# Patient Record
Sex: Female | Born: 1995
Health system: Southern US, Community
[De-identification: ages and names within clinical notes are randomized; demographics above are authoritative.]

---

## 2018-03-19 LAB — HIV ANTIBODY (ROUTINE TESTING W REFLEX): HIV 1&2 Ab, 4th Generation: NONREACTIVE

## 2018-03-30 ENCOUNTER — Ambulatory Visit: Payer: BLUE CROSS/BLUE SHIELD | Admitting: Nurse Practitioner

## 2018-03-30 ENCOUNTER — Encounter: Payer: Self-pay | Admitting: Nurse Practitioner

## 2018-03-30 ENCOUNTER — Other Ambulatory Visit: Payer: Self-pay

## 2018-03-30 VITALS — BP 114/78 | HR 82 | Temp 99.0°F | Ht 63.0 in | Wt 129.8 lb

## 2018-03-30 DIAGNOSIS — R590 Localized enlarged lymph nodes: Secondary | ICD-10-CM

## 2018-03-30 LAB — CBC WITH DIFFERENTIAL/PLATELET
Basophils Absolute: 0.1 10*3/uL (ref 0.0–0.1)
Basophils Relative: 2.2 % (ref 0.0–3.0)
Eosinophils Absolute: 0.1 10*3/uL (ref 0.0–0.7)
Eosinophils Relative: 3.3 % (ref 0.0–5.0)
HCT: 38.6 % (ref 36.0–46.0)
Hemoglobin: 12.9 g/dL (ref 12.0–15.0)
Lymphocytes Relative: 43.6 % (ref 12.0–46.0)
Lymphs Abs: 1.8 10*3/uL (ref 0.7–4.0)
MCHC: 33.3 g/dL (ref 30.0–36.0)
MCV: 103 fl — ABNORMAL HIGH (ref 78.0–100.0)
MONO ABS: 0.3 10*3/uL (ref 0.1–1.0)
Monocytes Relative: 7.2 % (ref 3.0–12.0)
Neutro Abs: 1.8 10*3/uL (ref 1.4–7.7)
Neutrophils Relative %: 43.7 % (ref 43.0–77.0)
Platelets: 333 10*3/uL (ref 150.0–400.0)
RBC: 3.75 Mil/uL — ABNORMAL LOW (ref 3.87–5.11)
RDW: 11.7 % (ref 11.5–15.5)
WBC: 4.2 10*3/uL (ref 4.0–10.5)

## 2018-03-30 NOTE — Progress Notes (Signed)
Subjective:  Patient ID: Lindsay Atkins, female    DOB: [birth year]  Age: 23 y.o. MRN: [id number]  CC: neck mass (R side of neck/jaw line, onset months ago. )  HPI Lindsay Atkins is here to establish care and to discuss the presence of right side neck mass, onset 69months ago, unchanged. No Hx of malignancy, denies any recent URI No tobacco use, no illicit drug use, social use of ETOH.  She had STD screen completed 02/2018: HIV, RPR, GC/chlamydia and Trich (negative) Reviewed lab results via patient's mobile device  [facility] with [facility], use of OCP: Triprevifem Sexually active, no condom use. Last PAP 03/25/18: normal per patient, no Hx of abnormal PAP.  Reviewed past Medical, Social and Family history today.  Outpatient Medications Prior to Visit  Medication Sig Dispense Refill  . TRI-PREVIFEM 0.18/0.215/0.25 MG-35 MCG tablet      No facility-administered medications prior to visit.     ROS Review of Systems  Constitutional: Negative.   HENT: Negative.   Respiratory: Negative.   Cardiovascular: Negative.   Musculoskeletal: Negative.   Skin: Negative.   Neurological: Negative.   Endo/Heme/Allergies: Negative.   Psychiatric/Behavioral: Negative.      Objective:  BP 114/78   Pulse 82   Temp 99 F (37.2 C) (Oral)   Ht 5\' 3"  (1.6 m)   Wt 129 lb 12.8 oz (58.9 kg)   SpO2 97%   BMI 22.99 kg/m   BP Readings from Last 3 Encounters:  03/30/18 114/78    Wt Readings from Last 3 Encounters:  03/30/18 129 lb 12.8 oz (58.9 kg)    Physical Exam HENT:     Head:     Salivary Glands: Right salivary gland is not diffusely enlarged or tender. Left salivary gland is not diffusely enlarged or tender.     Right Ear: Tympanic membrane, ear canal and external ear normal.     Left Ear: Tympanic membrane, ear canal and external ear normal.     Nose: Nose normal.     Mouth/Throat:     Mouth: Mucous membranes are moist.     Tongue: No lesions.     Pharynx: Uvula midline. No  posterior oropharyngeal erythema.     Tonsils: No tonsillar exudate. Swelling: 0 on the right. 0 on the left.  Neck:     Musculoskeletal: Full passive range of motion without pain, normal range of motion and neck supple.     Thyroid: No thyroid mass, thyromegaly or thyroid tenderness.   Cardiovascular:     Rate and Rhythm: Normal rate.     Pulses: Normal pulses.  Pulmonary:     Effort: Pulmonary effort is normal.  Psychiatric:        Mood and Affect: Mood normal.        Behavior: Behavior normal.    Lab Results  Component Value Date   WBC 4.2 03/30/2018   HGB 12.9 03/30/2018   HCT 38.6 03/30/2018   PLT 333.0 03/30/2018     Assessment & Plan:   Lindsay Atkins was seen today for neck mass.  Diagnoses and all orders for this visit:  Lymphadenopathy, periauricular -     CBC w/Diff -     US SOFT TISSUE HEAD & NECK (NON-THYROID); Future   I am having Lindsay Atkins maintain her Tri-Previfem.  No orders of the defined types were placed in this encounter.   Problem List Items Addressed This Visit    None    Visit Diagnoses  Lymphadenopathy, periauricular    -  Primary   Relevant Orders   CBC w/Diff (Completed)   US SOFT TISSUE HEAD & NECK (NON-THYROID)       Follow-up: Return if symptoms worsen or fail to improve.  Alysia Penna, NP

## 2018-03-30 NOTE — Patient Instructions (Addendum)
You will be contacted to schedule neck US.  Normal CBC  Please send copy of recent STD screen via mychart.  Lymphadenopathy  Lymphadenopathy means that your lymph glands are swollen or larger than normal (enlarged). Lymph glands, also called lymph nodes, are collections of tissue that filter bacteria, viruses, and waste from your bloodstream. They are part of your body's disease-fighting system (immune system), which protects your body from germs. There may be different causes of lymphadenopathy, depending on where it is in your body. Some types go away on their own. Lymphadenopathy can occur anywhere that you have lymph glands, including these areas:  Neck (cervical lymphadenopathy).  Chest (mediastinal lymphadenopathy).  Lungs (hilar lymphadenopathy).  Underarms (axillary lymphadenopathy).  Groin (inguinal lymphadenopathy). When your immune system responds to germs, infection-fighting cells and fluid build up in your lymph glands. This causes some swelling and enlargement. If the lymph glands do not go back to normal after you have an infection or disease, your health care provider may do tests. These tests help to monitor your condition and find the reason why the glands are still swollen and enlarged. Follow these instructions at home:  Get plenty of rest.  Take over-the-counter and prescription medicines only as told by your health care provider. Your health care provider may recommend over-the-counter medicines for pain.  If directed, apply heat to swollen lymph glands as often as told by your health care provider. Use the heat source that your health care provider recommends, such as a moist heat pack or a heating pad. ? Place a towel between your skin and the heat source. ? Leave the heat on for 20-30 minutes. ? Remove the heat if your skin turns bright red. This is especially important if you are unable to feel pain, heat, or cold. You may have a greater risk of getting  burned.  Check your affected lymph glands every day for changes. Check other lymph gland areas as told by your health care provider. Check for changes such as: ? More swelling. ? Sudden increase in size. ? Redness or pain. ? Hardness.  Keep all follow-up visits as told by your health care provider. This is important. Contact a health care provider if you have:  Swelling that gets worse or spreads to other areas.  Problems with breathing.  Lymph glands that: ? Are still swollen after 2 weeks. ? Have suddenly gotten bigger. ? Are red, painful, or hard.  A fever or chills.  Fatigue.  A sore throat.  Pain in your abdomen.  Weight loss.  Night sweats. Get help right away if you have:  Fluid leaking from an enlarged lymph gland.  Severe pain.  Chest pain.  Shortness of breath. Summary  Lymphadenopathy means that your lymph glands are swollen or larger than normal (enlarged).  Lymph glands (also called lymph nodes) are collections of tissue that filter bacteria, viruses, and waste from the bloodstream. They are part of your body's disease-fighting system (immune system).  Lymphadenopathy can occur anywhere that you have lymph glands.  If your enlarged and swollen lymph glands do not go back to normal after you have an infection or disease, your health care provider may do tests to monitor your condition and find the reason why the glands are still swollen and enlarged.  Check your affected lymph glands every day for changes. Check other lymph gland areas as told by your health care provider. This information is not intended to replace advice given to you by your health care  provider. Make sure you discuss any questions you have with your health care provider. Document Released: 10/15/2007 Document Revised: 11/20/2016 Document Reviewed: 11/20/2016 Elsevier Interactive Patient Education  2019 [employer].

## 2018-04-06 ENCOUNTER — Other Ambulatory Visit: Payer: BLUE CROSS/BLUE SHIELD

## 2018-04-15 ENCOUNTER — Encounter: Payer: Self-pay | Admitting: Nurse Practitioner

## 2018-04-15 LAB — GC, CHLAMYDIA, RPR PANEL
Chlamydia trachomatis, NAA: NEGATIVE
Neisseria gonorrhoeae, NAA: NEGATIVE

## 2018-04-15 LAB — TRICH VAG BY NAA: Trich vag by NAA: NEGATIVE

## 2018-04-15 LAB — RPR: RPR: NONREACTIVE

## 2018-04-15 NOTE — Progress Notes (Signed)
Abstracted result and sent to scan  

## 2018-05-18 ENCOUNTER — Telehealth: Payer: Self-pay | Admitting: Nurse Practitioner

## 2018-05-18 NOTE — Telephone Encounter (Signed)
I called and left message on patient voicemail per Alysia Penna to call office and schedule virtual visit follow up.

## 2019-01-02 ENCOUNTER — Telehealth: Payer: BLUE CROSS/BLUE SHIELD | Admitting: Family

## 2019-01-02 DIAGNOSIS — J209 Acute bronchitis, unspecified: Secondary | ICD-10-CM | POA: Diagnosis not present

## 2019-01-02 MED ORDER — BENZONATATE 100 MG PO CAPS: 100.0000 mg | ORAL_CAPSULE | Freq: Three times a day (TID) | ORAL | 0 refills | Status: DC | PRN

## 2019-01-02 NOTE — Progress Notes (Signed)
We are sorry that you are not feeling well.  Here is how we plan to help!  Based on your presentation I believe you most likely have A cough due to a virus.  This is called viral bronchitis and is best treated by rest, plenty of fluids and control of the cough.  You may use Ibuprofen or Tylenol as directed to help your symptoms.     In addition you may use A non-prescription cough medication called Robitussin DAC. Take 2 teaspoons every 8 hours or Delsym: take 2 teaspoons every 12 hours. and A prescription cough medication called Tessalon Perles 100mg. You may take 1-2 capsules every 8 hours as needed for your cough.    From your responses in the eVisit questionnaire you describe inflammation in the upper respiratory tract which is causing a significant cough.  This is commonly called Bronchitis and has four common causes:    Allergies  Viral Infections  Acid Reflux  Bacterial Infection Allergies, viruses and acid reflux are treated by controlling symptoms or eliminating the cause. An example might be a cough caused by taking certain blood pressure medications. You stop the cough by changing the medication. Another example might be a cough caused by acid reflux. Controlling the reflux helps control the cough.  USE OF BRONCHODILATOR ("RESCUE") INHALERS: There is a risk from using your bronchodilator too frequently.  The risk is that over-reliance on a medication which only relaxes the muscles surrounding the breathing tubes can reduce the effectiveness of medications prescribed to reduce swelling and congestion of the tubes themselves.  Although you feel brief relief from the bronchodilator inhaler, your asthma may actually be worsening with the tubes becoming more swollen and filled with mucus.  This can delay other crucial treatments, such as oral steroid medications. If you need to use a bronchodilator inhaler daily, several times per day, you should discuss this with your provider.  There are  probably better treatments that could be used to keep your asthma under control.     HOME CARE . Only take medications as instructed by your medical team. . Complete the entire course of an antibiotic. . Drink plenty of fluids and get plenty of rest. . Avoid close contacts especially the very young and the elderly . Cover your mouth if you cough or cough into your sleeve. . Always remember to wash your hands . A steam or ultrasonic humidifier can help congestion.   GET HELP RIGHT AWAY IF: . You develop worsening fever. . You become short of breath . You cough up blood. . Your symptoms persist after you have completed your treatment plan MAKE SURE YOU   Understand these instructions.  Will watch your condition.  Will get help right away if you are not doing well or get worse.  Your e-visit answers were reviewed by a board certified advanced clinical practitioner to complete your personal care plan.  Depending on the condition, your plan could have included both over the counter or prescription medications. If there is a problem please reply  once you have received a response from your provider. Your safety is important to us.  If you have drug allergies check your prescription carefully.    You can use MyChart to ask questions about today's visit, request a non-urgent call back, or ask for a work or school excuse for 24 hours related to this e-Visit. If it has been greater than 24 hours you will need to follow up with your provider, or enter   a new e-Visit to address those concerns. You will get an e-mail in the next two days asking about your experience.  I hope that your e-visit has been valuable and will speed your recovery. Thank you for using e-visits.  Approximately 5 minutes was spent documenting and reviewing patient's chart.    

## 2019-10-05 LAB — CBC AND DIFFERENTIAL
HCT: 37 (ref 36–46)
Hemoglobin: 12.6 (ref 12.0–16.0)
Platelets: 312 (ref 150–399)
WBC: 10

## 2019-10-05 LAB — VITAMIN B12: Vitamin B-12: 527

## 2019-10-05 LAB — IRON,TIBC AND FERRITIN PANEL
Ferritin: 73
Iron: 118
UIBC: 197

## 2019-10-05 LAB — CBC: RBC: 3.74 — AB (ref 3.87–5.11)

## 2019-10-05 LAB — TSH: TSH: 0.45 (ref <=5.90)

## 2019-10-05 LAB — VITAMIN D 25 HYDROXY (VIT D DEFICIENCY, FRACTURES): Vit D, 25-Hydroxy: 16.5

## 2019-11-16 ENCOUNTER — Encounter: Payer: Self-pay | Admitting: Nurse Practitioner

## 2019-11-21 ENCOUNTER — Ambulatory Visit: Payer: BC Managed Care – PPO | Admitting: Nurse Practitioner

## 2019-12-05 ENCOUNTER — Other Ambulatory Visit: Payer: Self-pay

## 2019-12-05 ENCOUNTER — Ambulatory Visit: Payer: BC Managed Care – PPO | Admitting: Nurse Practitioner

## 2019-12-05 ENCOUNTER — Encounter: Payer: Self-pay | Admitting: Nurse Practitioner

## 2019-12-05 VITALS — BP 100/70 | HR 92 | Temp 97.3°F | Ht 63.0 in | Wt 125.6 lb

## 2019-12-05 DIAGNOSIS — R590 Localized enlarged lymph nodes: Secondary | ICD-10-CM | POA: Diagnosis not present

## 2019-12-05 DIAGNOSIS — R7989 Other specified abnormal findings of blood chemistry: Secondary | ICD-10-CM | POA: Diagnosis not present

## 2019-12-05 DIAGNOSIS — R300 Dysuria: Secondary | ICD-10-CM

## 2019-12-05 DIAGNOSIS — L659 Nonscarring hair loss, unspecified: Secondary | ICD-10-CM

## 2019-12-05 NOTE — Patient Instructions (Signed)
Sign medical release to get records from GYN and dermatology.  Go to lab for blood draw.

## 2019-12-05 NOTE — Progress Notes (Signed)
Subjective:  Patient ID: Lindsay Atkins, female    DOB: [birth year]  Age: 24 y.o. MRN: [id number]  CC: Follow-up (Pt seen dermatologist last month and was informed her blood work came back indicating low thyroid and is following up regarding that. )  HPI She was informed her TSh was low 84month ago by dermatology. She was seen due to hair loss and low vit.D. no weight loss, no cold/heat intolerance, no irregular cycle. No FHx of thyroid disease. No OTC supplements She was treated for UTI 7month ago,will like repeat UA.  Reviewed past Medical, Social and Family history today.  Outpatient Medications Prior to Visit  Medication Sig Dispense Refill  . Vitamin D, Ergocalciferol, (DRISDOL) 1.25 MG (50000 UNIT) CAPS capsule Take 50,000 Units by mouth once a week.    . benzonatate (TESSALON PERLES) 100 MG capsule Take 1 capsule (100 mg total) by mouth 3 (three) times daily as needed. (Patient not taking: Reported on 12/05/2019) 20 capsule 0  . TRI-PREVIFEM 0.18/0.215/0.25 MG-35 MCG tablet      No facility-administered medications prior to visit.    ROS See HPI  Objective:  BP 100/70 (BP Location: Left Arm, Patient Position: Sitting, Cuff Size: Normal)   Pulse 92   Temp (!) 97.3 F (36.3 C) (Temporal)   Ht 5\' 3"  (1.6 m)   Wt 125 lb 9.6 oz (57 kg)   SpO2 98%   BMI 22.25 kg/m   Physical Exam Neck:     Thyroid: No thyroid mass, thyromegaly or thyroid tenderness.  Cardiovascular:     Rate and Rhythm: Normal rate.     Pulses: Normal pulses.  Pulmonary:     Effort: Pulmonary effort is normal.  Musculoskeletal:     Cervical back: Normal range of motion and neck supple.  Lymphadenopathy:     Cervical: Cervical adenopathy present.  Neurological:     Mental Status: She is alert and oriented to person, place, and time.     Assessment & Plan:  This visit occurred during the SARS-CoV-2 public health emergency.  Safety protocols were in place, including screening questions prior to the  visit, additional usage of staff PPE, and extensive cleaning of exam room while observing appropriate contact time as indicated for disinfecting solutions.   Cherysh was seen today for follow-up.  Diagnoses and all orders for this visit:  Abnormal TSH -     Thyroid Panel With TSH -     Thyroid peroxidase antibody -     Thyroid stimulating immunoglobulin  Lymphadenopathy, periauricular -     Cancel: Wilnette Kales Soft Tissue Head/Neck (NON-THYROID); Future -     US Soft Tissue Head/Neck (NON-THYROID); Future  Hair loss -     Thyroid Panel With TSH -     Thyroid peroxidase antibody -     Thyroid stimulating immunoglobulin  Dysuria -     Urinalysis w microscopic + reflex cultur -     REFLEXIVE URINE CULTURE  normal thyroid panel, thyroid US not needed at this time  Problem List Items Addressed This Visit    None    Visit Diagnoses    Abnormal TSH    -  Primary   Relevant Orders   Thyroid Panel With TSH (Completed)   Thyroid peroxidase antibody (Completed)   Thyroid stimulating immunoglobulin   Lymphadenopathy, periauricular       Relevant Orders   US Soft Tissue Head/Neck (NON-THYROID)   Hair loss       Relevant Orders   Thyroid Panel  With TSH (Completed)   Thyroid peroxidase antibody (Completed)   Thyroid stimulating immunoglobulin   Dysuria       Relevant Orders   Urinalysis w microscopic + reflex cultur (Completed)   REFLEXIVE URINE CULTURE (Completed)      Follow-up: Return if symptoms worsen or fail to improve.  Alysia Penna, NP

## 2019-12-06 ENCOUNTER — Encounter: Payer: Self-pay | Admitting: Nurse Practitioner

## 2019-12-11 LAB — THYROID PANEL WITH TSH
Free Thyroxine Index: 2.8 (ref 1.4–3.8)
T3 Uptake: 32 % (ref 22–35)
T4, Total: 8.9 ug/dL (ref 5.1–11.9)
TSH: 1.28 mIU/L

## 2019-12-11 LAB — URINALYSIS W MICROSCOPIC + REFLEX CULTURE
Bacteria, UA: NONE SEEN /HPF
Bilirubin Urine: NEGATIVE
Glucose, UA: NEGATIVE
Hgb urine dipstick: NEGATIVE
Hyaline Cast: NONE SEEN /LPF
Ketones, ur: NEGATIVE
Leukocyte Esterase: NEGATIVE
Nitrites, Initial: NEGATIVE
Protein, ur: NEGATIVE
RBC / HPF: NONE SEEN /HPF (ref 0–2)
Specific Gravity, Urine: 1.017 (ref 1.001–1.03)
Squamous Epithelial / HPF: NONE SEEN /HPF (ref <=5)
WBC, UA: NONE SEEN /HPF (ref 0–5)
pH: <=5 (ref 5.0–8.0)

## 2019-12-11 LAB — THYROID STIMULATING IMMUNOGLOBULIN: TSI: <89 % baseline (ref <140)

## 2019-12-11 LAB — NO CULTURE INDICATED

## 2019-12-11 LAB — THYROID PEROXIDASE ANTIBODY: Thyroperoxidase Ab SerPl-aCnc: 2 IU/mL (ref <9)

## 2019-12-29 ENCOUNTER — Ambulatory Visit
Admission: RE | Admit: 2019-12-29 | Discharge: 2019-12-29 | Disposition: A | Discharge status: Home / Self Care | Payer: BC Managed Care – PPO | Source: Ambulatory Visit | Attending: Nurse Practitioner | Admitting: Nurse Practitioner

## 2019-12-29 DIAGNOSIS — R590 Localized enlarged lymph nodes: Secondary | ICD-10-CM

## 2019-12-31 ENCOUNTER — Encounter: Payer: Self-pay | Admitting: Nurse Practitioner

## 2019-12-31 ENCOUNTER — Other Ambulatory Visit: Payer: Self-pay | Admitting: Nurse Practitioner

## 2019-12-31 DIAGNOSIS — K118 Other diseases of salivary glands: Secondary | ICD-10-CM

## 2019-12-31 DIAGNOSIS — R590 Localized enlarged lymph nodes: Secondary | ICD-10-CM

## 2020-01-22 DIAGNOSIS — F341 Dysthymic disorder: Secondary | ICD-10-CM | POA: Diagnosis not present

## 2020-01-28 ENCOUNTER — Other Ambulatory Visit: Payer: BC Managed Care – PPO

## 2020-03-31 DIAGNOSIS — T391X1A Poisoning by 4-Aminophenol derivatives, accidental (unintentional), initial encounter: Secondary | ICD-10-CM | POA: Diagnosis not present

## 2020-03-31 DIAGNOSIS — E876 Hypokalemia: Secondary | ICD-10-CM | POA: Diagnosis not present

## 2020-03-31 DIAGNOSIS — R000 Tachycardia, unspecified: Secondary | ICD-10-CM | POA: Diagnosis not present

## 2020-03-31 DIAGNOSIS — Z9114 Patient's other noncompliance with medication regimen: Secondary | ICD-10-CM | POA: Diagnosis not present

## 2020-03-31 DIAGNOSIS — T391X2A Poisoning by 4-Aminophenol derivatives, intentional self-harm, initial encounter: Secondary | ICD-10-CM | POA: Diagnosis not present

## 2020-03-31 DIAGNOSIS — F419 Anxiety disorder, unspecified: Secondary | ICD-10-CM | POA: Diagnosis not present

## 2020-03-31 DIAGNOSIS — F332 Major depressive disorder, recurrent severe without psychotic features: Secondary | ICD-10-CM | POA: Diagnosis not present

## 2020-03-31 DIAGNOSIS — T391X4A Poisoning by 4-Aminophenol derivatives, undetermined, initial encounter: Secondary | ICD-10-CM | POA: Diagnosis not present

## 2020-03-31 DIAGNOSIS — Z20822 Contact with and (suspected) exposure to covid-19: Secondary | ICD-10-CM | POA: Diagnosis not present

## 2020-03-31 DIAGNOSIS — X58XXXA Exposure to other specified factors, initial encounter: Secondary | ICD-10-CM | POA: Diagnosis not present

## 2020-03-31 DIAGNOSIS — Z3202 Encounter for pregnancy test, result negative: Secondary | ICD-10-CM | POA: Diagnosis not present

## 2020-03-31 DIAGNOSIS — I498 Other specified cardiac arrhythmias: Secondary | ICD-10-CM | POA: Diagnosis not present

## 2020-03-31 DIAGNOSIS — Y999 Unspecified external cause status: Secondary | ICD-10-CM | POA: Diagnosis not present

## 2020-04-01 DIAGNOSIS — I498 Other specified cardiac arrhythmias: Secondary | ICD-10-CM | POA: Diagnosis not present

## 2020-04-01 DIAGNOSIS — T391X1A Poisoning by 4-Aminophenol derivatives, accidental (unintentional), initial encounter: Secondary | ICD-10-CM | POA: Diagnosis not present

## 2020-04-01 DIAGNOSIS — R000 Tachycardia, unspecified: Secondary | ICD-10-CM | POA: Diagnosis not present

## 2020-04-02 DIAGNOSIS — T391X1A Poisoning by 4-Aminophenol derivatives, accidental (unintentional), initial encounter: Secondary | ICD-10-CM | POA: Diagnosis not present

## 2020-04-02 DIAGNOSIS — R000 Tachycardia, unspecified: Secondary | ICD-10-CM | POA: Diagnosis not present

## 2020-04-03 DIAGNOSIS — T391X1A Poisoning by 4-Aminophenol derivatives, accidental (unintentional), initial encounter: Secondary | ICD-10-CM | POA: Diagnosis not present

## 2020-04-03 DIAGNOSIS — T391X4A Poisoning by 4-Aminophenol derivatives, undetermined, initial encounter: Secondary | ICD-10-CM | POA: Diagnosis not present

## 2020-04-03 DIAGNOSIS — R000 Tachycardia, unspecified: Secondary | ICD-10-CM | POA: Diagnosis not present

## 2020-04-04 DIAGNOSIS — F332 Major depressive disorder, recurrent severe without psychotic features: Secondary | ICD-10-CM | POA: Diagnosis not present

## 2020-04-04 DIAGNOSIS — T391X1A Poisoning by 4-Aminophenol derivatives, accidental (unintentional), initial encounter: Secondary | ICD-10-CM | POA: Diagnosis not present

## 2020-04-05 DIAGNOSIS — T391X2A Poisoning by 4-Aminophenol derivatives, intentional self-harm, initial encounter: Secondary | ICD-10-CM | POA: Diagnosis not present

## 2020-04-05 DIAGNOSIS — F332 Major depressive disorder, recurrent severe without psychotic features: Secondary | ICD-10-CM | POA: Diagnosis not present

## 2020-04-10 DIAGNOSIS — Z01419 Encounter for gynecological examination (general) (routine) without abnormal findings: Secondary | ICD-10-CM | POA: Diagnosis not present

## 2020-04-10 DIAGNOSIS — Z124 Encounter for screening for malignant neoplasm of cervix: Secondary | ICD-10-CM | POA: Diagnosis not present

## 2020-04-10 DIAGNOSIS — Z3202 Encounter for pregnancy test, result negative: Secondary | ICD-10-CM | POA: Diagnosis not present

## 2020-04-10 DIAGNOSIS — Z113 Encounter for screening for infections with a predominantly sexual mode of transmission: Secondary | ICD-10-CM | POA: Diagnosis not present

## 2020-04-10 DIAGNOSIS — N925 Other specified irregular menstruation: Secondary | ICD-10-CM | POA: Diagnosis not present

## 2020-04-17 DIAGNOSIS — F341 Dysthymic disorder: Secondary | ICD-10-CM | POA: Diagnosis not present

## 2020-04-19 DIAGNOSIS — N3000 Acute cystitis without hematuria: Secondary | ICD-10-CM | POA: Diagnosis not present

## 2020-04-19 DIAGNOSIS — R350 Frequency of micturition: Secondary | ICD-10-CM | POA: Diagnosis not present

## 2020-05-01 DIAGNOSIS — F341 Dysthymic disorder: Secondary | ICD-10-CM | POA: Diagnosis not present

## 2020-05-08 DIAGNOSIS — F341 Dysthymic disorder: Secondary | ICD-10-CM | POA: Diagnosis not present

## 2020-05-08 DIAGNOSIS — F411 Generalized anxiety disorder: Secondary | ICD-10-CM | POA: Diagnosis not present

## 2020-05-15 DIAGNOSIS — F341 Dysthymic disorder: Secondary | ICD-10-CM | POA: Diagnosis not present

## 2020-05-15 DIAGNOSIS — F411 Generalized anxiety disorder: Secondary | ICD-10-CM | POA: Diagnosis not present

## 2020-05-23 DIAGNOSIS — F411 Generalized anxiety disorder: Secondary | ICD-10-CM | POA: Diagnosis not present

## 2020-05-23 DIAGNOSIS — F341 Dysthymic disorder: Secondary | ICD-10-CM | POA: Diagnosis not present

## 2020-05-29 DIAGNOSIS — F411 Generalized anxiety disorder: Secondary | ICD-10-CM | POA: Diagnosis not present

## 2020-06-05 DIAGNOSIS — F411 Generalized anxiety disorder: Secondary | ICD-10-CM | POA: Diagnosis not present

## 2020-06-12 DIAGNOSIS — F411 Generalized anxiety disorder: Secondary | ICD-10-CM | POA: Diagnosis not present

## 2020-06-25 DIAGNOSIS — F341 Dysthymic disorder: Secondary | ICD-10-CM | POA: Diagnosis not present

## 2020-07-02 DIAGNOSIS — F341 Dysthymic disorder: Secondary | ICD-10-CM | POA: Diagnosis not present

## 2020-07-09 DIAGNOSIS — F341 Dysthymic disorder: Secondary | ICD-10-CM | POA: Diagnosis not present

## 2020-07-16 DIAGNOSIS — F341 Dysthymic disorder: Secondary | ICD-10-CM | POA: Diagnosis not present

## 2020-07-23 DIAGNOSIS — F341 Dysthymic disorder: Secondary | ICD-10-CM | POA: Diagnosis not present

## 2020-07-30 DIAGNOSIS — F341 Dysthymic disorder: Secondary | ICD-10-CM | POA: Diagnosis not present

## 2020-08-06 DIAGNOSIS — F341 Dysthymic disorder: Secondary | ICD-10-CM | POA: Diagnosis not present

## 2020-08-13 DIAGNOSIS — F341 Dysthymic disorder: Secondary | ICD-10-CM | POA: Diagnosis not present

## 2020-08-20 DIAGNOSIS — F411 Generalized anxiety disorder: Secondary | ICD-10-CM | POA: Diagnosis not present

## 2020-08-27 DIAGNOSIS — F341 Dysthymic disorder: Secondary | ICD-10-CM | POA: Diagnosis not present

## 2020-09-03 DIAGNOSIS — F341 Dysthymic disorder: Secondary | ICD-10-CM | POA: Diagnosis not present

## 2020-09-05 DIAGNOSIS — Z124 Encounter for screening for malignant neoplasm of cervix: Secondary | ICD-10-CM | POA: Diagnosis not present

## 2020-09-05 DIAGNOSIS — N925 Other specified irregular menstruation: Secondary | ICD-10-CM | POA: Diagnosis not present

## 2020-09-05 DIAGNOSIS — Z3480 Encounter for supervision of other normal pregnancy, unspecified trimester: Secondary | ICD-10-CM | POA: Diagnosis not present

## 2020-09-05 DIAGNOSIS — Z113 Encounter for screening for infections with a predominantly sexual mode of transmission: Secondary | ICD-10-CM | POA: Diagnosis not present

## 2020-09-05 DIAGNOSIS — Z3201 Encounter for pregnancy test, result positive: Secondary | ICD-10-CM | POA: Diagnosis not present

## 2020-09-05 LAB — OB RESULTS CONSOLE GC/CHLAMYDIA
Chlamydia: NEGATIVE
Gonorrhea: NEGATIVE

## 2020-09-05 LAB — OB RESULTS CONSOLE HIV ANTIBODY (ROUTINE TESTING): HIV: NONREACTIVE

## 2020-09-05 LAB — OB RESULTS CONSOLE RPR: RPR: NONREACTIVE

## 2020-09-05 LAB — HEPATITIS C ANTIBODY: HCV Ab: NEGATIVE

## 2020-09-05 LAB — OB RESULTS CONSOLE RUBELLA ANTIBODY, IGM: Rubella: IMMUNE

## 2020-09-05 LAB — OB RESULTS CONSOLE HEPATITIS B SURFACE ANTIGEN: Hepatitis B Surface Ag: NEGATIVE

## 2020-09-10 DIAGNOSIS — F341 Dysthymic disorder: Secondary | ICD-10-CM | POA: Diagnosis not present

## 2020-09-17 DIAGNOSIS — F341 Dysthymic disorder: Secondary | ICD-10-CM | POA: Diagnosis not present

## 2020-09-24 DIAGNOSIS — F341 Dysthymic disorder: Secondary | ICD-10-CM | POA: Diagnosis not present

## 2020-10-01 DIAGNOSIS — F341 Dysthymic disorder: Secondary | ICD-10-CM | POA: Diagnosis not present

## 2020-10-03 DIAGNOSIS — Z23 Encounter for immunization: Secondary | ICD-10-CM | POA: Diagnosis not present

## 2020-10-08 DIAGNOSIS — F341 Dysthymic disorder: Secondary | ICD-10-CM | POA: Diagnosis not present

## 2020-10-15 DIAGNOSIS — F341 Dysthymic disorder: Secondary | ICD-10-CM | POA: Diagnosis not present

## 2020-10-22 DIAGNOSIS — F341 Dysthymic disorder: Secondary | ICD-10-CM | POA: Diagnosis not present

## 2020-10-29 DIAGNOSIS — F341 Dysthymic disorder: Secondary | ICD-10-CM | POA: Diagnosis not present

## 2020-10-31 DIAGNOSIS — Z369 Encounter for antenatal screening, unspecified: Secondary | ICD-10-CM | POA: Diagnosis not present

## 2020-11-05 DIAGNOSIS — F341 Dysthymic disorder: Secondary | ICD-10-CM | POA: Diagnosis not present

## 2020-11-06 DIAGNOSIS — S6722XA Crushing injury of left hand, initial encounter: Secondary | ICD-10-CM | POA: Diagnosis not present

## 2020-11-06 DIAGNOSIS — S60415A Abrasion of left ring finger, initial encounter: Secondary | ICD-10-CM | POA: Diagnosis not present

## 2020-11-12 DIAGNOSIS — F341 Dysthymic disorder: Secondary | ICD-10-CM | POA: Diagnosis not present

## 2021-01-19 NOTE — L&D Delivery Note (Signed)
Delivery Note ? ? ?Pt progressed to complete dilation +3 station and pushed great about 10 minutes.  At 7:21 PM a healthy female was delivered via Vaginal, Spontaneous (Presentation: Left Occiput Anterior).  APGAR: 9, ; weight pending .   ?Placenta status: Spontaneous, Intact.  Cord:   with the following complications: Short.   ? ?Anesthesia: Epidural ?Episiotomy: None ?Lacerations: None ?Suture Repair:  N/A ?Est. Blood Loss (mL): ? ?Mom to postpartum.  Baby to Couplet care / Skin to Skin. ? ?D/w parents circumcision and they desire ? ?Lindsay Atkins ?04/13/2021, 7:39 PM ? ? ? ?

## 2021-01-29 LAB — OB RESULTS CONSOLE RPR: RPR: NONREACTIVE

## 2021-03-28 LAB — OB RESULTS CONSOLE GBS
GBS: NEGATIVE
GBS: POSITIVE

## 2021-04-07 ENCOUNTER — Encounter (HOSPITAL_COMMUNITY): Payer: Self-pay | Admitting: Obstetrics and Gynecology

## 2021-04-07 ENCOUNTER — Inpatient Hospital Stay (HOSPITAL_COMMUNITY)
Admission: AD | Admit: 2021-04-07 | Discharge: 2021-04-07 | Disposition: A | Discharge status: Home / Self Care | Payer: Medicaid Other | Attending: Obstetrics and Gynecology | Admitting: Obstetrics and Gynecology

## 2021-04-07 DIAGNOSIS — O471 False labor at or after 37 completed weeks of gestation: Secondary | ICD-10-CM | POA: Diagnosis present

## 2021-04-07 DIAGNOSIS — Z3493 Encounter for supervision of normal pregnancy, unspecified, third trimester: Secondary | ICD-10-CM | POA: Diagnosis not present

## 2021-04-07 DIAGNOSIS — Z3A38 38 weeks gestation of pregnancy: Secondary | ICD-10-CM | POA: Insufficient documentation

## 2021-04-07 LAB — WET PREP, GENITAL
Sperm: NONE SEEN
Trich, Wet Prep: NONE SEEN
WBC, Wet Prep HPF POC: >=10 — AB (ref <10)
Yeast Wet Prep HPF POC: NONE SEEN

## 2021-04-07 LAB — POCT FERN TEST: POCT Fern Test: NEGATIVE

## 2021-04-07 NOTE — MAU Note (Signed)
Lindsay Atkins is a 26 y.o. at [redacted]w[redacted]d here in MAU reporting: started having period cramps 2-3 hrs ago.  Having some leakage about an hour ago, watery, doesn't know if it is just d/c or water.  Called OB, was told to come in. Was closed last Wed.  ? ?Onset of complaint: 1400 ?Pain score: 5 ?Vitals:  ? 04/07/21 1718  ?BP: 117/75  ?Pulse: 78  ?Resp: 18  ?Temp: 98.1 ?F (36.7 ?C)  ?SpO2: 99%  ?   ?FHT:152 ?Lab orders placed from triage:   ? ?

## 2021-04-07 NOTE — MAU Provider Note (Signed)
None  ?  ?S: Ms. Tabrina Esty is a 26 y.o. G2P0010 at [redacted]w[redacted]d  who presents to MAU today complaining leaking clear, watery discharge since 3pm. It was enough to soak through a panty liner. Has not had any gushes of fluid since. She reports irregular contractions. She denies vaginal bleeding. She reports normal fetal movement.   ? ?O: BP 117/75 (BP Location: Right Arm)   Pulse 78   Temp 98.1 ?F (36.7 ?C) (Oral)   Resp 18   Ht 5\' 3"  (1.6 m)   Wt 76.7 kg   LMP 07/13/2020   SpO2 99%   BMI 29.95 kg/m?  ?GENERAL: Well-developed, well-nourished female in no acute distress.  ?HEAD: Normocephalic, atraumatic.  ?CHEST: Normal effort of breathing, regular heart rate ?ABDOMEN: Soft, nontender, gravid ?PELVIC: NEFG, vaginal walls pink with rugae, small amount of thin, white discharge with scant white patches on vaginal walls, no pooling of amniotic fluid, no bleeding, cervix visually closed without lesions/masses ? ?Fern: negative ?Wet prep: negative ? ?Cervical exam:  ?Dilation: Closed ?Effacement (%): Thick ?Presentation: Undeterminable ?Exam by:: [email or web address], CNM ? ? ?Fetal Monitoring: ?Baseline: 140 bpm ?Variability: moderate ?Accelerations: +15x15 accels present ?Decelerations: absent ?Contractions: occasional ? ? ?A: ?SIUP at [redacted]w[redacted]d  ?False labor ?Intact amniotic membranes ? ? ?P: ?Discharge home in stable condition ?Return to MAU sooner or as needed ?Keep OB appointment as scheduled on Wednesday 04/09/21 ? ? ? ?04/11/21, CNM ?04/07/2021 6:51 PM ? ?

## 2021-04-12 ENCOUNTER — Inpatient Hospital Stay (HOSPITAL_COMMUNITY)
Admission: AD | Admit: 2021-04-12 | Discharge: 2021-04-15 | DRG: 807 | Disposition: A | Discharge status: Home / Self Care | Payer: Medicaid Other | Attending: Obstetrics and Gynecology | Admitting: Obstetrics and Gynecology

## 2021-04-12 ENCOUNTER — Encounter (HOSPITAL_COMMUNITY): Payer: Self-pay | Admitting: Obstetrics and Gynecology

## 2021-04-12 ENCOUNTER — Other Ambulatory Visit: Payer: Self-pay

## 2021-04-12 DIAGNOSIS — O368390 Maternal care for abnormalities of the fetal heart rate or rhythm, unspecified trimester, not applicable or unspecified: Secondary | ICD-10-CM | POA: Diagnosis not present

## 2021-04-12 DIAGNOSIS — O26893 Other specified pregnancy related conditions, third trimester: Secondary | ICD-10-CM | POA: Diagnosis present

## 2021-04-12 DIAGNOSIS — O99824 Streptococcus B carrier state complicating childbirth: Secondary | ICD-10-CM | POA: Diagnosis present

## 2021-04-12 DIAGNOSIS — Z3A39 39 weeks gestation of pregnancy: Secondary | ICD-10-CM | POA: Diagnosis not present

## 2021-04-12 DIAGNOSIS — B951 Streptococcus, group B, as the cause of diseases classified elsewhere: Secondary | ICD-10-CM

## 2021-04-12 LAB — COMPREHENSIVE METABOLIC PANEL
ALT: 11 U/L (ref 0–44)
AST: 19 U/L (ref 15–41)
Albumin: 3 g/dL — ABNORMAL LOW (ref 3.5–5.0)
Alkaline Phosphatase: 292 U/L — ABNORMAL HIGH (ref 38–126)
Anion gap: 10 (ref 5–15)
BUN: 11 mg/dL (ref 6–20)
CO2: 17 mmol/L — ABNORMAL LOW (ref 22–32)
Calcium: 9.4 mg/dL (ref 8.9–10.3)
Chloride: 108 mmol/L (ref 98–111)
Creatinine, Ser: 0.75 mg/dL (ref 0.44–1.00)
GFR, Estimated: >60 mL/min (ref >60)
Glucose, Bld: 110 mg/dL — ABNORMAL HIGH (ref 70–99)
Potassium: 3.3 mmol/L — ABNORMAL LOW (ref 3.5–5.1)
Sodium: 135 mmol/L (ref 135–145)
Total Bilirubin: 0.5 mg/dL (ref 0.3–1.2)
Total Protein: 7.4 g/dL (ref 6.5–8.1)

## 2021-04-12 LAB — TYPE AND SCREEN
ABO/RH(D): A POS
Antibody Screen: NEGATIVE

## 2021-04-12 LAB — POCT FERN TEST: POCT Fern Test: NEGATIVE

## 2021-04-12 LAB — CBC
HCT: 37.4 % (ref 36.0–46.0)
Hemoglobin: 12.5 g/dL (ref 12.0–15.0)
MCH: 34.3 pg — ABNORMAL HIGH (ref 26.0–34.0)
MCHC: 33.4 g/dL (ref 30.0–36.0)
MCV: 102.7 fL — ABNORMAL HIGH (ref 80.0–100.0)
Platelets: 206 10*3/uL (ref 150–400)
RBC: 3.64 MIL/uL — ABNORMAL LOW (ref 3.87–5.11)
RDW: 12.2 % (ref 11.5–15.5)
WBC: 7.1 10*3/uL (ref 4.0–10.5)
nRBC: 0 % (ref 0.0–0.2)

## 2021-04-12 MED ORDER — OXYCODONE-ACETAMINOPHEN 5-325 MG PO TABS: 1.0000 | ORAL_TABLET | ORAL | Status: DC | PRN

## 2021-04-12 MED ORDER — LACTATED RINGERS IV SOLN
500.0000 mL | INTRAVENOUS | Status: DC | PRN
  Administered 2021-04-13: 500 mL via INTRAVENOUS

## 2021-04-12 MED ORDER — LACTATED RINGERS IV SOLN: INTRAVENOUS | Status: DC

## 2021-04-12 MED ORDER — ONDANSETRON HCL 4 MG/2ML IJ SOLN: 4.0000 mg | Freq: Four times a day (QID) | INTRAMUSCULAR | Status: DC | PRN

## 2021-04-12 MED ORDER — OXYTOCIN BOLUS FROM INFUSION
333.0000 mL | Freq: Once | INTRAVENOUS | Status: AC
  Administered 2021-04-13: 333 mL via INTRAVENOUS

## 2021-04-12 MED ORDER — LIDOCAINE HCL (PF) 1 % IJ SOLN: 30.0000 mL | INTRAMUSCULAR | Status: DC | PRN

## 2021-04-12 MED ORDER — FLEET ENEMA 7-19 GM/118ML RE ENEM: 1.0000 | ENEMA | RECTAL | Status: DC | PRN

## 2021-04-12 MED ORDER — OXYTOCIN-SODIUM CHLORIDE 30-0.9 UT/500ML-% IV SOLN
2.5000 [IU]/h | INTRAVENOUS | Status: DC
  Administered 2021-04-13: 2.5 [IU]/h via INTRAVENOUS
  Filled 2021-04-12: qty 500

## 2021-04-12 MED ORDER — OXYCODONE-ACETAMINOPHEN 5-325 MG PO TABS: 2.0000 | ORAL_TABLET | ORAL | Status: DC | PRN

## 2021-04-12 MED ORDER — SOD CITRATE-CITRIC ACID 500-334 MG/5ML PO SOLN: 30.0000 mL | ORAL | Status: DC | PRN

## 2021-04-12 MED ORDER — ACETAMINOPHEN 325 MG PO TABS: 650.0000 mg | ORAL_TABLET | ORAL | Status: DC | PRN

## 2021-04-12 NOTE — H&P (Signed)
Lindsay Atkins is a 26 y.o. female G2P0010 at 47 0/7 weeks (EDD 04/19/21 by LMP c/w 7 week [place]) presented to MAU for possible LOF.  She subsequently was not found to be ruptured, but there was concern about her FHR tracing initially as it was non-reactive.  It became reactive after about 1 1/2 hours and looked normal, however, the patient was offered to stay for IOL given the concern raised earlier. ?Prenatal care significant for +GBS, otherwise uneventful ? ?OB History   ? ? Gravida  ?2  ? Para  ?   ? Term  ?   ? Preterm  ?   ? AB  ?1  ? Living  ?   ?  ? ? SAB  ?1  ? IAB  ?   ? Ectopic  ?   ? Multiple  ?   ? Live Births  ?   ?   ?  ?  ? ?History reviewed. No pertinent past medical history. ?History reviewed. No pertinent surgical history. ?Family History: family history includes Alcohol abuse in her maternal grandfather; Drug abuse in her maternal grandfather; Hearing loss in her paternal grandmother; Heart attack in her paternal grandmother; Heart disease (age of onset: 46) in her paternal grandmother; Hyperlipidemia in her paternal grandmother; Hypertension in her paternal grandmother; Miscarriages / [place] in her mother; Stroke in her paternal grandmother. ?Social History:  reports that she has never smoked. She has never used smokeless tobacco. She reports current alcohol use. She reports that she does not use drugs. ? ? ?  ?Maternal Diabetes: No ?Genetic Screening: Declined ?Maternal Ultrasounds/Referrals: Normal ?Fetal Ultrasounds or other Referrals:  None ?Maternal Substance Abuse:  No ?Significant Maternal Medications:  None ?Significant Maternal Lab Results:  Group B Strep positive ?Other Comments:  None ? ?Review of Systems  ?Constitutional:  Negative for fever.  ?Gastrointestinal:  Negative for abdominal pain.  ?Genitourinary:  Negative for vaginal bleeding.  ?Maternal Medical History:  ?Contractions: Frequency: irregular.   ?Perceived severity is mild.   ?Fetal activity: Perceived fetal activity is  normal.   ?Prenatal complications: +GBS ?Prenatal Complications - Diabetes: none. ? ?  ?Blood pressure 120/79, pulse 97, temperature 98.6 ?F (37 ?C), temperature source Oral, resp. rate 18, height 5\' 3"  (1.6 m), weight 76.5 kg, last menstrual period 07/13/2020, SpO2 98 %. ?Maternal Exam:  ?Uterine Assessment: Contraction strength is mild.  Contraction frequency is irregular.  ?Abdomen: Patient reports no abdominal tenderness. Fetal presentation: vertex ?Introitus: Normal vulva. Normal vagina.   ?Physical Exam ?Constitutional:   ?   Appearance: Normal appearance.  ?Cardiovascular:  ?   Rate and Rhythm: Normal rate and regular rhythm.  ?Pulmonary:  ?   Effort: Pulmonary effort is normal.  ?Abdominal:  ?   Palpations: Abdomen is soft.  ?Genitourinary: ?   General: Normal vulva.  ?Neurological:  ?   Mental Status: She is alert.  ?Psychiatric:     ?   Mood and Affect: Mood normal.  ?  ?Prenatal labs: ?ABO, Rh:  A positive ?Antibody:  neg ?Rubella:  Immune ?RPR:   NR ?HBsAg:   Neg ?HIV:   NR ?GBS:   Positive ?Hgb AA ?One hour GCT 112 ? ?Assessment/Plan: ?Pt given option of admission and ripening with IOL given some initial concern with the FHR tracing that is now category 1.   ?WIll admit and begin cytotec ripening when bed available  ? ?07/15/2020 ?04/12/2021, 5:45 PM ? ? ? ? ?

## 2021-04-12 NOTE — MAU Note (Signed)
Lindsay Atkins is a 26 y.o. at [redacted]w[redacted]d here in MAU reporting: she thinks her water broke this morning @ 1148 this morning, reports fluid is clear.   ? ?Onset of complaint: today 1148 ?Pain score: 3/10 cramping ?Vitals:  ? 04/12/21 1342  ?BP: 120/79  ?Pulse: 97  ?Resp: 18  ?Temp: 98.6 ?F (37 ?C)  ?SpO2: 98%  ?   ?FHT: 144bpm w/ +FM.  Denies VB ?Lab orders placed from triage:    ?

## 2021-04-12 NOTE — MAU Provider Note (Signed)
S: Ms. Lindsay Atkins is a 25 y.o. G2P0010 at [redacted]w[redacted]d  who presents to MAU today complaining of leaking of fluid since 0800. She denies vaginal bleeding. She endorses contractions. She reports normal fetal movement.   ? ?O: BP 120/79 (BP Location: Right Arm)   Pulse 97   Temp 98.6 ?F (37 ?C) (Oral)   Resp 18   Ht 5\' 3"  (1.6 m)   Wt 76.5 kg   LMP 07/13/2020   SpO2 98%   BMI 29.87 kg/m?  ?GENERAL: Well-developed, well-nourished female in no acute distress.  ?HEAD: Normocephalic, atraumatic.  ?CHEST: Normal effort of breathing, regular heart rate ?ABDOMEN: Soft, nontender, gravid ?PELVIC: Normal external female genitalia. Vagina is pink and rugated. Cervix with normal contour, no lesions. Normal discharge.  negative pooling.  ? ?Cervical exam:  ? ?Cervix closed, thick, posterior. Exam by 07/15/2020, NP  ? ?Fetal Monitoring: ?Baseline: 135 BMP ?Variability: Moderate  ?Accelerations: 15x15 ?Decelerations: Variables  ?Contractions: Occasional  ? ?No results found for this or any previous visit (from the past 24 hour(s)). ? ? ?Offered admission for 39 weeks with a period of minimal variability noted on fetal tracing. Reviewed tracing with Dr. Venia Carbon who also agreed tracing is reassuring however not reactive.  ?Discussed with Dr. Donavan Foil who is ok with offering admission to the patient. ?Patient and partner unsure about staying- desires natural birth and natural delivery, concerned about long induction process ?Senaida Ores- discussed concerns with Dr. [id number], requested Dr. Senaida Ores to come to MAU to speak with patient and partner.  ? ? ?A: ?SIUP at [redacted]w[redacted]d  ?Initial fetal tracing non-reactive, however reactive after 1-2 hours of fetal tracing in MAU.  ? ? ?P: ?Admit to labor and delivery.  ?GBS positive  ? ?[redacted]w[redacted]d, NP ?04/12/2021 3:02 PM  ?

## 2021-04-13 ENCOUNTER — Inpatient Hospital Stay (HOSPITAL_COMMUNITY): Payer: Medicaid Other | Admitting: Anesthesiology

## 2021-04-13 ENCOUNTER — Encounter (HOSPITAL_COMMUNITY): Payer: Self-pay | Admitting: Obstetrics and Gynecology

## 2021-04-13 LAB — RPR: RPR Ser Ql: NONREACTIVE

## 2021-04-13 MED ORDER — TETANUS-DIPHTH-ACELL PERTUSSIS 5-2.5-18.5 LF-MCG/0.5 IM SUSY: 0.5000 mL | PREFILLED_SYRINGE | Freq: Once | INTRAMUSCULAR | Status: DC

## 2021-04-13 MED ORDER — PRENATAL MULTIVITAMIN CH
1.0000 | ORAL_TABLET | Freq: Every day | ORAL | Status: DC
  Administered 2021-04-14 – 2021-04-15 (×2): 1 via ORAL
  Filled 2021-04-13 (×2): qty 1

## 2021-04-13 MED ORDER — SIMETHICONE 80 MG PO CHEW: 80.0000 mg | CHEWABLE_TABLET | ORAL | Status: DC | PRN

## 2021-04-13 MED ORDER — ONDANSETRON HCL 4 MG/2ML IJ SOLN: 4.0000 mg | Freq: Four times a day (QID) | INTRAMUSCULAR | Status: DC | PRN

## 2021-04-13 MED ORDER — LACTATED RINGERS IV SOLN: INTRAVENOUS | Status: DC

## 2021-04-13 MED ORDER — SENNOSIDES-DOCUSATE SODIUM 8.6-50 MG PO TABS
2.0000 | ORAL_TABLET | ORAL | Status: DC
  Administered 2021-04-13 – 2021-04-15 (×2): 2 via ORAL
  Filled 2021-04-13 (×2): qty 2

## 2021-04-13 MED ORDER — OXYCODONE-ACETAMINOPHEN 5-325 MG PO TABS: 1.0000 | ORAL_TABLET | ORAL | Status: DC | PRN

## 2021-04-13 MED ORDER — FENTANYL-BUPIVACAINE-NACL 0.5-0.125-0.9 MG/250ML-% EP SOLN
12.0000 mL/h | EPIDURAL | Status: DC | PRN
  Administered 2021-04-13: 12 mL/h via EPIDURAL
  Filled 2021-04-13: qty 250

## 2021-04-13 MED ORDER — PHENYLEPHRINE 40 MCG/ML (10ML) SYRINGE FOR IV PUSH (FOR BLOOD PRESSURE SUPPORT): 80.0000 ug | PREFILLED_SYRINGE | INTRAVENOUS | Status: DC | PRN

## 2021-04-13 MED ORDER — FENTANYL CITRATE (PF) 100 MCG/2ML IJ SOLN
50.0000 ug | Freq: Once | INTRAMUSCULAR | Status: AC
  Administered 2021-04-13: 50 ug via INTRAVENOUS
  Filled 2021-04-13: qty 2

## 2021-04-13 MED ORDER — DIPHENHYDRAMINE HCL 50 MG/ML IJ SOLN
12.5000 mg | INTRAMUSCULAR | Status: DC | PRN
  Administered 2021-04-13 (×2): 12.5 mg via INTRAVENOUS
  Filled 2021-04-13: qty 1

## 2021-04-13 MED ORDER — TERBUTALINE SULFATE 1 MG/ML IJ SOLN: 0.2500 mg | Freq: Once | INTRAMUSCULAR | Status: DC | PRN

## 2021-04-13 MED ORDER — ACETAMINOPHEN 325 MG PO TABS: 650.0000 mg | ORAL_TABLET | ORAL | Status: DC | PRN

## 2021-04-13 MED ORDER — OXYTOCIN BOLUS FROM INFUSION: 333.0000 mL | Freq: Once | INTRAVENOUS | Status: DC

## 2021-04-13 MED ORDER — ZOLPIDEM TARTRATE 5 MG PO TABS: 5.0000 mg | ORAL_TABLET | Freq: Every evening | ORAL | Status: DC | PRN

## 2021-04-13 MED ORDER — WITCH HAZEL-GLYCERIN EX PADS: 1.0000 "application " | MEDICATED_PAD | CUTANEOUS | Status: DC | PRN

## 2021-04-13 MED ORDER — COCONUT OIL OIL
1.0000 "application " | TOPICAL_OIL | Status: DC | PRN
  Administered 2021-04-15: 1 via TOPICAL

## 2021-04-13 MED ORDER — DIBUCAINE (PERIANAL) 1 % EX OINT: 1.0000 "application " | TOPICAL_OINTMENT | CUTANEOUS | Status: DC | PRN

## 2021-04-13 MED ORDER — IBUPROFEN 600 MG PO TABS
600.0000 mg | ORAL_TABLET | Freq: Four times a day (QID) | ORAL | Status: DC
  Administered 2021-04-14 – 2021-04-15 (×5): 600 mg via ORAL
  Filled 2021-04-13 (×6): qty 1

## 2021-04-13 MED ORDER — OXYCODONE-ACETAMINOPHEN 5-325 MG PO TABS: 2.0000 | ORAL_TABLET | ORAL | Status: DC | PRN

## 2021-04-13 MED ORDER — LACTATED RINGERS IV SOLN
500.0000 mL | Freq: Once | INTRAVENOUS | Status: AC
  Administered 2021-04-13: 500 mL via INTRAVENOUS

## 2021-04-13 MED ORDER — SODIUM CHLORIDE 0.9 % IV SOLN
5.0000 10*6.[IU] | Freq: Once | INTRAVENOUS | Status: AC
  Administered 2021-04-13: 5 10*6.[IU] via INTRAVENOUS
  Filled 2021-04-13: qty 5

## 2021-04-13 MED ORDER — DIPHENHYDRAMINE HCL 25 MG PO CAPS: 25.0000 mg | ORAL_CAPSULE | Freq: Four times a day (QID) | ORAL | Status: DC | PRN

## 2021-04-13 MED ORDER — PHENYLEPHRINE 40 MCG/ML (10ML) SYRINGE FOR IV PUSH (FOR BLOOD PRESSURE SUPPORT)
80.0000 ug | PREFILLED_SYRINGE | INTRAVENOUS | Status: DC | PRN
  Filled 2021-04-13: qty 10

## 2021-04-13 MED ORDER — SOD CITRATE-CITRIC ACID 500-334 MG/5ML PO SOLN: 30.0000 mL | ORAL | Status: DC | PRN

## 2021-04-13 MED ORDER — EPHEDRINE 5 MG/ML INJ: 10.0000 mg | INTRAVENOUS | Status: DC | PRN

## 2021-04-13 MED ORDER — ONDANSETRON HCL 4 MG/2ML IJ SOLN: 4.0000 mg | INTRAMUSCULAR | Status: DC | PRN

## 2021-04-13 MED ORDER — ONDANSETRON HCL 4 MG PO TABS: 4.0000 mg | ORAL_TABLET | ORAL | Status: DC | PRN

## 2021-04-13 MED ORDER — LIDOCAINE HCL (PF) 1 % IJ SOLN
INTRAMUSCULAR | Status: DC | PRN
  Administered 2021-04-13: 8 mL via EPIDURAL

## 2021-04-13 MED ORDER — MISOPROSTOL 25 MCG QUARTER TABLET
25.0000 ug | ORAL_TABLET | ORAL | Status: DC | PRN
  Administered 2021-04-13: 25 ug via VAGINAL
  Filled 2021-04-13: qty 1

## 2021-04-13 MED ORDER — LACTATED RINGERS IV SOLN
500.0000 mL | INTRAVENOUS | Status: DC | PRN
  Administered 2021-04-13 (×2): 500 mL via INTRAVENOUS

## 2021-04-13 MED ORDER — OXYTOCIN-SODIUM CHLORIDE 30-0.9 UT/500ML-% IV SOLN
1.0000 m[IU]/min | INTRAVENOUS | Status: DC
  Administered 2021-04-13: 1 m[IU]/min via INTRAVENOUS
  Administered 2021-04-13: 2 m[IU]/min via INTRAVENOUS

## 2021-04-13 MED ORDER — BENZOCAINE-MENTHOL 20-0.5 % EX AERO
1.0000 "application " | INHALATION_SPRAY | CUTANEOUS | Status: DC | PRN
  Administered 2021-04-14: 1 via TOPICAL
  Filled 2021-04-13: qty 56

## 2021-04-13 MED ORDER — OXYTOCIN-SODIUM CHLORIDE 30-0.9 UT/500ML-% IV SOLN: 2.5000 [IU]/h | INTRAVENOUS | Status: DC

## 2021-04-13 MED ORDER — PENICILLIN G POT IN DEXTROSE 60000 UNIT/ML IV SOLN
3.0000 10*6.[IU] | INTRAVENOUS | Status: DC
  Administered 2021-04-13 (×3): 3 10*6.[IU] via INTRAVENOUS
  Filled 2021-04-13 (×3): qty 50

## 2021-04-13 MED ORDER — LIDOCAINE HCL (PF) 1 % IJ SOLN: 30.0000 mL | INTRAMUSCULAR | Status: DC | PRN

## 2021-04-13 MED ORDER — BUTORPHANOL TARTRATE 1 MG/ML IJ SOLN
1.0000 mg | INTRAMUSCULAR | Status: DC | PRN
  Administered 2021-04-13: 1 mg via INTRAVENOUS
  Filled 2021-04-13: qty 1

## 2021-04-13 NOTE — Lactation Note (Signed)
This note was copied from a baby's chart. ?Lactation Consultation Note ? ?Patient Name: Lindsay Atkins ?Today's Date: 04/13/2021 ?Reason for consult: L&D Initial assessment ?Age:28 hours ?Mom latched infant on her right breast using the football hold position, infant latched with depth sustaining the latch, infant was still BF after 10 minutes when LC left the room. ?Mom will attempt to latch infant on both breast during a feeding. ?Mom will continue to BF infant according primal cues, 8 to 12+ times within 24 hours, skin to skin. ?Mom knows to call RN/LC on MBU if she needs further assistance with latching infant at the breast. ?Maternal Data ?  ? ?Feeding ?Mother's Current Feeding Choice: Breast Milk ? ?LATCH Score ?Latch: Grasps breast easily, tongue down, lips flanged, rhythmical sucking. ? ?Audible Swallowing: Spontaneous and intermittent ? ?Type of Nipple: Everted at rest and after stimulation ? ?Comfort (Breast/Nipple): Soft / non-tender ? ?Hold (Positioning): Assistance needed to correctly position infant at breast and maintain latch. ? ?LATCH Score: 9 ? ? ?Lactation Tools Discussed/Used ?  ? ?Interventions ?Interventions: Assisted with latch;Skin to skin;Breast compression;Adjust position;Support pillows;Position options;Education ? ?Discharge ?  ? ?Consult Status ?Consult Status: Follow-up from L&D ? ? ? ?Danelle Earthly ?04/13/2021, 8:26 PM ? ? ? ?

## 2021-04-13 NOTE — Anesthesia Preprocedure Evaluation (Signed)
Anesthesia Evaluation  Patient identified by MRN, date of birth, ID band Patient awake    Reviewed: Allergy & Precautions, NPO status , Patient's Chart, lab work & pertinent test results  Airway Mallampati: II  TM Distance: >3 FB Neck ROM: Full    Dental no notable dental hx.    Pulmonary neg pulmonary ROS,    Pulmonary exam normal breath sounds clear to auscultation       Cardiovascular negative cardio ROS Normal cardiovascular exam Rhythm:Regular Rate:Normal     Neuro/Psych negative neurological ROS  negative psych ROS   GI/Hepatic negative GI ROS, Neg liver ROS,   Endo/Other  negative endocrine ROS  Renal/GU negative Renal ROS  negative genitourinary   Musculoskeletal negative musculoskeletal ROS (+)   Abdominal   Peds negative pediatric ROS (+)  Hematology negative hematology ROS (+)   Anesthesia Other Findings   Reproductive/Obstetrics (+) Pregnancy                             Anesthesia Physical Anesthesia Plan  ASA: 2  Anesthesia Plan: Epidural   Post-op Pain Management:    Induction:   PONV Risk Score and Plan: 2 and Treatment may vary due to age or medical condition  Airway Management Planned: Natural Airway  Additional Equipment:   Intra-op Plan:   Post-operative Plan:   Informed Consent: I have reviewed the patients History and Physical, chart, labs and discussed the procedure including the risks, benefits and alternatives for the proposed anesthesia with the patient or authorized representative who has indicated his/her understanding and acceptance.       Plan Discussed with: Anesthesiologist  Anesthesia Plan Comments:         Anesthesia Quick Evaluation  

## 2021-04-13 NOTE — Progress Notes (Signed)
Patient ID: Lindsay Atkins, female   DOB: [birth year], 26 y.o.   MRN: [id number] ?Strip reviewed ? ?Pt has had intermittent late decelerations that resolve with boluses and position changes.  + variability and some accels.  Lates seem to occur when contractions are prolonged from cytotec ? ?Will not place another cytotec.  May try pitocin in a while if FHR allows.  L&D very busy right now so will just hold off until FHR tracing back to category 1 ?

## 2021-04-13 NOTE — Progress Notes (Signed)
Patient ID: Lindsay Atkins, female   DOB: 11-Apr-1995, 26 y.o.   MRN: [id number] ?Pt just making it to L&D after holding in MAU for 7 hours. ? ?FHR overall category 1 ? ?Cervix 50/1+/-3 vtx posterior ? ?Cytotec placed in vagina ?Will follow progress. ?

## 2021-04-13 NOTE — Progress Notes (Signed)
Patient ID: Lindsay Atkins, female   DOB: 10/29/1995, 26 y.o.   MRN: [id number] ?Late entry note. ? ?Pt got uncomfortable and received epidural about 1100am.   ?She had SROM at 0900 AM, then contractions got more painful. ? ?FHR improved to category 1 ? ?Cervix at 1130am was 2.5/80/-1 ? ?Plan is to augment with pitocin if no cervical change ?

## 2021-04-13 NOTE — Progress Notes (Signed)
Patient ID: Lindsay Atkins, female   DOB: [birth year], 26 y.o.   MRN: [id number] ?Pt comfortable with epidural ?Afeb VSS ?Ctx spaced out ? ?Cervix 80/3+/-1 ? ?Start pitocin 48mu by 1 mu ? ?Follow progress. ?

## 2021-04-13 NOTE — Progress Notes (Signed)
Notified Dr. Marvel Plan of pt's BP (125/91, 126/88). Asymptomatic. Okay if DBP in 80s-90s. Will continue to monitor.  ?

## 2021-04-13 NOTE — Anesthesia Procedure Notes (Signed)
Epidural ?Patient location during procedure: OB ?Start time: 04/13/2021 11:02 AM ?End time: 04/13/2021 11:12 AM ? ?Staffing ?Anesthesiologist: Merlinda Frederick, MD ?Performed: anesthesiologist  ? ?Preanesthetic Checklist ?Completed: patient identified, IV checked, site marked, risks and benefits discussed, monitors and equipment checked, pre-op evaluation and timeout performed ? ?Epidural ?Patient position: sitting ?Prep: DuraPrep ?Patient monitoring: heart rate, cardiac monitor, continuous pulse ox and blood pressure ?Approach: midline ?Location: L2-L3 ?Injection technique: LOR saline ? ?Needle:  ?Needle type: Tuohy  ?Needle gauge: 17 G ?Needle length: 9 cm ?Needle insertion depth: 4 cm ?Catheter type: closed end flexible ?Catheter size: 20 Guage ?Catheter at skin depth: 9 cm ?Test dose: negative and Other ? ?Assessment ?Events: blood not aspirated, injection not painful, no injection resistance and negative IV test ? ?Additional Notes ?Informed consent obtained prior to proceeding including risk of failure, 1% risk of PDPH, risk of minor discomfort and bruising.  Discussed rare but serious complications including epidural abscess, permanent nerve injury, epidural hematoma.  Discussed alternatives to epidural analgesia and patient desires to proceed.  Timeout performed pre-procedure verifying patient name, procedure, and platelet count.  Patient tolerated procedure well. ? ? ? ? ?

## 2021-04-14 LAB — CBC
HCT: 31.1 % — ABNORMAL LOW (ref 36.0–46.0)
Hemoglobin: 10.5 g/dL — ABNORMAL LOW (ref 12.0–15.0)
MCH: 34.1 pg — ABNORMAL HIGH (ref 26.0–34.0)
MCHC: 33.8 g/dL (ref 30.0–36.0)
MCV: 101 fL — ABNORMAL HIGH (ref 80.0–100.0)
Platelets: 167 10*3/uL (ref 150–400)
RBC: 3.08 MIL/uL — ABNORMAL LOW (ref 3.87–5.11)
RDW: 12.4 % (ref 11.5–15.5)
WBC: 11.6 10*3/uL — ABNORMAL HIGH (ref 4.0–10.5)
nRBC: 0 % (ref 0.0–0.2)

## 2021-04-14 NOTE — Progress Notes (Signed)
Post Partum Day 1 ?Subjective: ?Lindsay Atkins is doing well this morning without complaints. Ambulating, voiding, tolerating PO. Minimal lochia. Breastfeeding.  ? ?Objective: ?Patient Vitals for the past 24 hrs: ? BP Temp Temp src Pulse Resp SpO2  ?04/14/21 0900 116/85 98.4 ?F (36.9 ?C) Oral 81 18 --  ?04/14/21 0620 119/79 98.5 ?F (36.9 ?C) Oral 73 20 98 %  ?04/14/21 0210 117/75 98.6 ?F (37 ?C) Oral 80 18 98 %  ?04/13/21 2216 126/88 -- -- 94 18 100 %  ?04/13/21 2122 (!) 125/91 98.5 ?F (36.9 ?C) Oral 90 18 100 %  ?04/13/21 2025 133/64 -- -- 88 18 --  ?04/13/21 2000 125/84 98.3 ?F (36.8 ?C) Axillary 89 18 --  ?04/13/21 1945 126/80 -- -- 88 18 --  ?04/13/21 1930 109/88 -- -- (!) 105 18 --  ?04/13/21 1905 119/77 -- -- 79 -- --  ?04/13/21 1830 117/75 -- -- 79 -- --  ?04/13/21 1800 115/78 -- -- 80 -- --  ?04/13/21 1730 126/90 98.4 ?F (36.9 ?C) Oral 76 18 --  ?04/13/21 1701 112/82 -- -- 73 -- --  ?04/13/21 1630 115/79 -- -- 76 -- --  ?04/13/21 1600 108/67 -- -- 80 -- --  ?04/13/21 1531 105/67 -- -- 89 -- --  ?04/13/21 1530 105/67 -- -- 89 -- --  ?04/13/21 1501 114/75 -- -- 68 -- --  ?04/13/21 1500 114/75 -- -- 68 -- --  ?04/13/21 1459 101/72 -- -- 80 -- --  ?04/13/21 1431 112/78 -- -- 67 -- --  ?04/13/21 1400 106/72 -- -- 65 -- --  ?04/13/21 1330 112/73 -- -- 77 -- --  ?04/13/21 1310 123/75 -- -- 74 -- --  ?04/13/21 1301 116/81 -- -- 69 -- --  ?04/13/21 1256 119/83 -- -- 68 -- --  ?04/13/21 1251 129/90 -- -- 74 -- --  ?04/13/21 1246 126/88 -- -- 72 -- --  ?04/13/21 1240 (!) 139/96 -- -- 96 -- --  ?04/13/21 1236 137/82 -- -- 86 -- --  ?04/13/21 1232 131/88 97.6 ?F (36.4 ?C) Oral 79 -- --  ? ? ?Physical Exam:  ?General: alert, cooperative, and no distress ?Lochia: appropriate ?Uterine Fundus: firm ?DVT Evaluation: No evidence of DVT seen on physical exam. ? ?Recent Labs  ?  04/12/21 ?1743 04/14/21 ?3716  ?WBC 7.1 11.6*  ?HGB 12.5 10.5*  ?HCT 37.4 31.1*  ?PLT 206 167  ? ? ?Recent Labs  ?  04/12/21 ?1743  ?NA 135  ?K 3.3*  ?CL 108   ?BUN 11  ?CREATININE 0.75  ?GLUCOSE 110*  ?BILITOT 0.5  ?ALT 11  ?AST 19  ?ALKPHOS 292*  ?PROT 7.4  ?ALBUMIN 3.0*  ? ? ?Recent Labs  ?  04/12/21 ?1743  ?CALCIUM 9.4  ? ? ?No results for input(s): PROTIME, APTT, INR in the last 72 hours. ? ?No results for input(s): PROTIME, APTT, INR, FIBRINOGEN in the last 72 hours. ?Assessment/Plan: ?  ?Lindsay Atkins 26 y.o. G2P1011 PPD#1 sp SVD ?1. PPC: routine PP care ?2. Circ desired - baby not yet cleared for circumcision. Will reassess for later today vs. Tomorrow ?3. Rh pos  ?4. Dispo: anticipate discharge home tomorrow ? ? LOS: 2 days  ? ?Charlett Nose ?04/14/2021, 12:30 PM  ? ?

## 2021-04-14 NOTE — Lactation Note (Signed)
This note was copied from a baby's chart. ?Lactation Consultation Note ? ?Patient Name: Lindsay Atkins ?Today's Date: 04/14/2021 ?Reason for consult: Follow-up assessment ?Age:28 hours ? ?P1, Assisted with latching in cross cradle with pillows for support.  Lips flanged with intermittent swallows. ?Feed on demand with cues.  Goal 8-12+ times per day after first 24 hrs.  Place baby STS if not cueing.  ? ?Maternal Data ?Has patient been taught Hand Expression?: Yes ?Does the patient have breastfeeding experience prior to this delivery?: No ? ?Feeding ?Mother's Current Feeding Choice: Breast Milk ? ?LATCH Score ?Latch: Grasps breast easily, tongue down, lips flanged, rhythmical sucking. ? ?Audible Swallowing: A few with stimulation ? ?Type of Nipple: Everted at rest and after stimulation ? ?Comfort (Breast/Nipple): Soft / non-tender ? ?Hold (Positioning): Assistance needed to correctly position infant at breast and maintain latch. ? ?LATCH Score: 8 ? ? ?Lactation Tools Discussed/Used ?  ? ?Interventions ?Interventions: Breast feeding basics reviewed;Assisted with latch;Skin to skin;Education;LC Services brochure ? ?Discharge ?  ? ?Consult Status ?Consult Status: Follow-up ?Date: 04/15/21 ?Follow-up type: In-patient ? ? ? ?Dahlia Byes Boschen ?04/14/2021, 9:33 AM ? ? ? ?

## 2021-04-14 NOTE — Anesthesia Postprocedure Evaluation (Signed)
Anesthesia Post Note ? ?Patient: Marizol Borror ? ?Procedure(s) Performed: AN AD HOC LABOR EPIDURAL ? ?  ? ?Patient location during evaluation: Mother Baby ?Anesthesia Type: Epidural ?Level of consciousness: awake and alert and oriented ?Pain management: satisfactory to patient ?Vital Signs Assessment: post-procedure vital signs reviewed and stable ?Respiratory status: respiratory function stable ?Cardiovascular status: stable ?Postop Assessment: no headache, no backache, epidural receding, patient able to bend at knees, no signs of nausea or vomiting, adequate PO intake and able to ambulate ?Anesthetic complications: no ? ? ?No notable events documented. ? ?Last Vitals:  ?Vitals:  ? 04/14/21 0900 04/14/21 1459  ?BP: 116/85 126/86  ?Pulse: 81 77  ?Resp: 18 17  ?Temp: 36.9 ?C 36.9 ?C  ?SpO2:  100%  ?  ?Last Pain:  ?Vitals:  ? 04/14/21 1545  ?TempSrc:   ?PainSc: 0-No pain  ? ?Pain Goal: Patients Stated Pain Goal: 3 (04/14/21 1026) ? ?  ?  ?  ?  ?  ?  ?  ? ?Roczen Waymire ? ? ? ? ?

## 2021-04-15 MED ORDER — IBUPROFEN 600 MG PO TABS: 600.0000 mg | ORAL_TABLET | Freq: Four times a day (QID) | ORAL | 0 refills | Status: AC | PRN

## 2021-04-15 NOTE — Lactation Note (Signed)
This note was copied from a baby's chart. ?Lactation Consultation Note ? ?Patient Name: Lindsay Atkins ?Today's Date: 04/15/2021 ?Reason for consult: Follow-up assessment;1st time breastfeeding ?Age:27 hours ? ?P1, Observed feeding after circumcision. ?Discussed pumping in addition to breastfeeding once home if baby is sleepy at breast. ?Feed on demand with cues.  Goal 8-12+ times per day after first 24 hrs.  Place baby STS if not cueing.  ?Reviewed engorgement care and monitoring voids/stools. ? ? ?Feeding ?Mother's Current Feeding Choice: Breast Milk ? ?LATCH Score ?Latch: Grasps breast easily, tongue down, lips flanged, rhythmical sucking. ? ?Audible Swallowing: Spontaneous and intermittent ? ?Type of Nipple: Everted at rest and after stimulation ? ?Comfort (Breast/Nipple): Soft / non-tender ? ?Hold (Positioning): Assistance needed to correctly position infant at breast and maintain latch. ? ?LATCH Score: 9 ? ? ?Lactation Tools Discussed/Used ?  ? ?Interventions ?Interventions: Breast feeding basics reviewed;Assisted with latch;Skin to skin;Hand express;Breast compression;Adjust position;Support pillows;Hand pump;Education ? ?Discharge ?Discharge Education: Engorgement and breast care;Warning signs for feeding baby ?Pump: Personal;DEBP ? ?Consult Status ?Consult Status: Complete ?Date: 04/15/21 ? ? ? ?Dahlia Byes Boschen ?04/15/2021, 12:15 PM ? ? ? ?

## 2021-04-15 NOTE — Discharge Summary (Signed)
? ?  Postpartum Discharge Summary ? ? ? ?   ?Patient Name: Lindsay Atkins ?DOB: 01/11/1996 ?MRN: [id number] ? ?Date of admission: 04/12/2021 ?Delivery date:04/13/2021  ?Delivering provider: Huel Cote  ?Date of discharge: 04/15/2021 ? ?Admitting diagnosis: Normal labor [O80, Z37.9] ?Indication for care in labor and delivery, antepartum [O75.9] ?NSVD (normal spontaneous vaginal delivery) [O80] ?Intrauterine pregnancy: [redacted]w[redacted]d     ?Secondary diagnosis:  Principal Problem: ?  Normal labor ?Active Problems: ?  Indication for care in labor and delivery, antepartum ?  NSVD (normal spontaneous vaginal delivery) ? ?   ?Discharge diagnosis: Term Pregnancy Delivered                                              ?Post partum procedures: NA ?Augmentation: AROM, Pitocin, and Cytotec ?Complications: None ? ?Hospital course: Induction of Labor With Vaginal Delivery   ?26 y.o. yo G2P1011 at [redacted]w[redacted]d was admitted to the hospital 04/12/2021 for induction of labor.  Indication for induction:  NRFWB, abnormal fetal tracing .  Patient had an uncomplicated labor course as follows: ?Membrane Rupture Time/Date: 8:51 AM ,04/13/2021   ?Delivery Method:Vaginal, Spontaneous  ?Episiotomy: None  ?Lacerations:  None  ?Details of delivery can be found in separate delivery note.  Patient had a routine postpartum course. Patient is discharged home 04/15/21. ? ?Newborn Data: ?Birth date:04/13/2021  ?Birth time:7:21 PM  ?Gender:Female  ?Living status:Living  ?Apgars:9 ,9  ?Weight:3090 g  ? ?Magnesium Sulfate received: No ?BMZ received: No ?Rhophylac:N/A ? ? ?Physical exam  ?Vitals:  ? 04/14/21 0900 04/14/21 1459 04/14/21 2124 04/15/21 0500  ?BP: 116/85 126/86 107/75 110/73  ?Pulse: 81 77 76 78  ?Resp: 18 17 16 17   ?Temp: 98.4 ?F (36.9 ?C) 98.5 ?F (36.9 ?C) 98.6 ?F (37 ?C) 98.7 ?F (37.1 ?C)  ?TempSrc: Oral Oral Oral Oral  ?SpO2:  100% 100% 100%  ?Weight:      ?Height:      ? ?General: alert, cooperative, and no distress ?Lochia: appropriate ?Uterine Fundus:  firm ?DVT Evaluation: No evidence of DVT seen on physical exam. ?Labs: ?Lab Results  ?Component Value Date  ? WBC 11.6 (H) 04/14/2021  ? HGB 10.5 (L) 04/14/2021  ? HCT 31.1 (L) 04/14/2021  ? MCV 101.0 (H) 04/14/2021  ? PLT 167 04/14/2021  ? ? ?  Latest Ref Rng & Units 04/12/2021  ?  5:43 PM  ?CMP  ?Glucose 70 - 99 mg/dL 04/14/2021    ?BUN 6 - 20 mg/dL 11    ?Creatinine 0.44 - 1.00 mg/dL 174    ?Sodium 135 - 145 mmol/L 135    ?Potassium 3.5 - 5.1 mmol/L 3.3    ?Chloride 98 - 111 mmol/L 108    ?CO2 22 - 32 mmol/L 17    ?Calcium 8.9 - 10.3 mg/dL 9.4    ?Total Protein 6.5 - 8.1 g/dL 7.4    ?Total Bilirubin 0.3 - 1.2 mg/dL 0.5    ?Alkaline Phos 38 - 126 U/L 292    ?AST 15 - 41 U/L 19    ?ALT 0 - 44 U/L 11    ? ?Edinburgh Score: ? ?  04/14/2021  ? 10:26 AM  ?04/16/2021 Postnatal Depression Scale Screening Tool  ?I have been able to laugh and see the funny side of things. 0  ?I have looked forward with enjoyment to things. 0  ?I have blamed myself unnecessarily  when things went wrong. 1  ?I have been anxious or worried for no good reason. 2  ?I have felt scared or panicky for no good reason. 2  ?Things have been getting on top of me. 0  ?I have been so unhappy that I have had difficulty sleeping. 0  ?I have felt sad or miserable. 0  ?I have been so unhappy that I have been crying. 0  ?The thought of harming myself has occurred to me. 0  ?Edinburgh Postnatal Depression Scale Total 5  ? ? ? ? ?After visit meds:  ?Allergies as of 04/15/2021   ?No Known Allergies ?  ? ?  ?Medication List  ?  ? ?STOP taking these medications   ? ?doxylamine (Sleep) 25 MG tablet ?Commonly known as: UNISOM ?  ?pyridoxine 100 MG tablet ?Commonly known as: B-6 ?  ? ?  ? ?TAKE these medications   ? ?ibuprofen 600 MG tablet ?Commonly known as: ADVIL ?Take 1 tablet (600 mg total) by mouth every 6 (six) hours as needed. ?  ?multivitamin-prenatal 27-0.8 MG Tabs tablet ?Take by mouth daily at 12 noon. ?  ? ?  ? ?  ?  ? ? ?  ?Discharge Care Instructions  ?(From  admission, onward)  ?  ? ? ?  ? ?  Start     Ordered  ? 04/15/21 0000  Discharge wound care:       ?Comments: For a cesarean delivery: ?You may wash incision with soap and water.  ?Do not soak or submerge the incision for 2 weeks. ?Keep incision dry. You may need to keep a sanitary pad or panty liner between the incision and your clothing for comfort and to keep the incision dry. ?If you note drainage, increased pain, or increased redness of the incision, then please notify your physician.  ? 04/15/21 1058  ? 04/15/21 0000  If the dressing is still on your incision site when you go home, remove it on the third day after your surgery date. Remove dressing if it begins to fall off, or if it is dirty or damaged before the third day.       ?Comments: For a cesarean delivery  ? 04/15/21 1058  ? ?  ?  ? ?  ? ? ? ?Discharge home in stable condition ?Infant Feeding: Bottle and Breast ?Infant Disposition:home with mother ?Discharge instruction: per After Visit Summary and Postpartum booklet. ?Activity: Advance as tolerated. Pelvic rest for 6 weeks.  ?Diet: routine diet ?Anticipated Birth Control: Unsure ?Postpartum Appointment:4 weeks ?Future Appointments:No future appointments. ?Follow up Visit: ? Follow-up Information   ? ? Waynard Reeds, MD. Schedule an appointment as soon as possible for a visit in 4 week(s).   ?Specialty: Obstetrics and Gynecology ?Why: For a postpartum evaluation ?Contact information: ?[address] ?[address] ?[place] [address] ?[phone] ? ? ?  ?  ? ?  ?  ? ?  ? ? ? ?  ? ?04/15/2021 ?Waynard Reeds, MD ? ? ?

## 2021-04-23 ENCOUNTER — Telehealth (HOSPITAL_COMMUNITY): Payer: Self-pay | Admitting: *Deleted

## 2021-04-23 NOTE — Telephone Encounter (Signed)
Attempted hospital discharge follow-up call. Left message for patient to return RN call. Deforest Hoyles, RN, 04/23/21, 1737 ?

## 2021-04-25 ENCOUNTER — Inpatient Hospital Stay (HOSPITAL_COMMUNITY)
Admission: AD | Admit: 2021-04-25 | Payer: PRIVATE HEALTH INSURANCE | Source: Home / Self Care | Admitting: Obstetrics and Gynecology

## 2021-04-25 ENCOUNTER — Inpatient Hospital Stay (HOSPITAL_COMMUNITY): Payer: PRIVATE HEALTH INSURANCE

## 2022-11-13 IMAGING — US US SOFT TISSUE HEAD/NECK
1 series · 14 of 25 positions shown · non-contrast
Comparison: None.

CLINICAL DATA: Cervical lymphadenopathy

EXAM:
ULTRASOUND OF HEAD/NECK SOFT TISSUES
TECHNIQUE: Ultrasound examination of the head and neck soft tissues was
performed in the area of clinical concern.

[Series 1: us soft tissue head/neck · 0.05mm/px · 29 acquisitions, 14 frames shown]
[im 1/29]
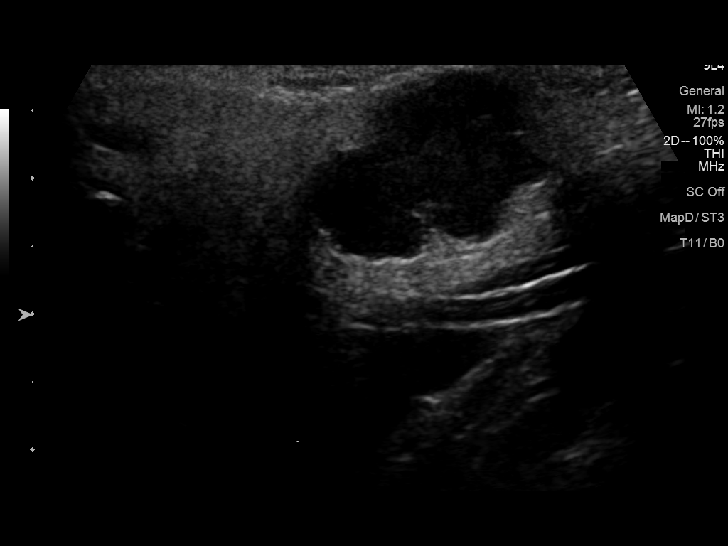
[im 3/29]
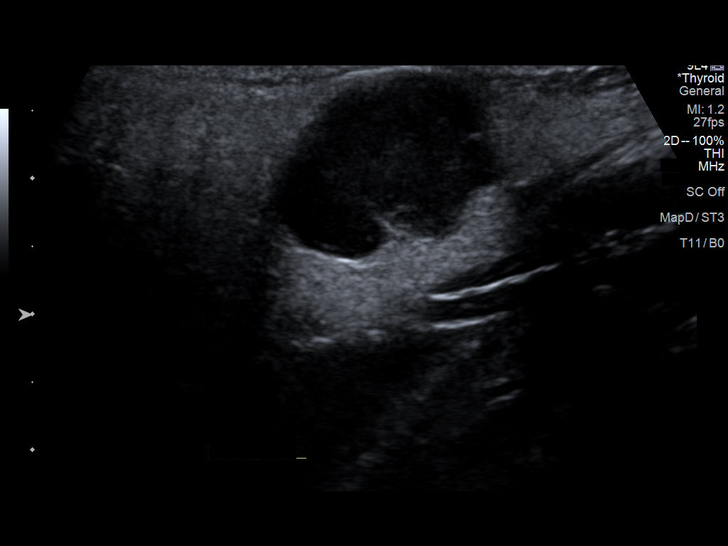
[im 5/29]
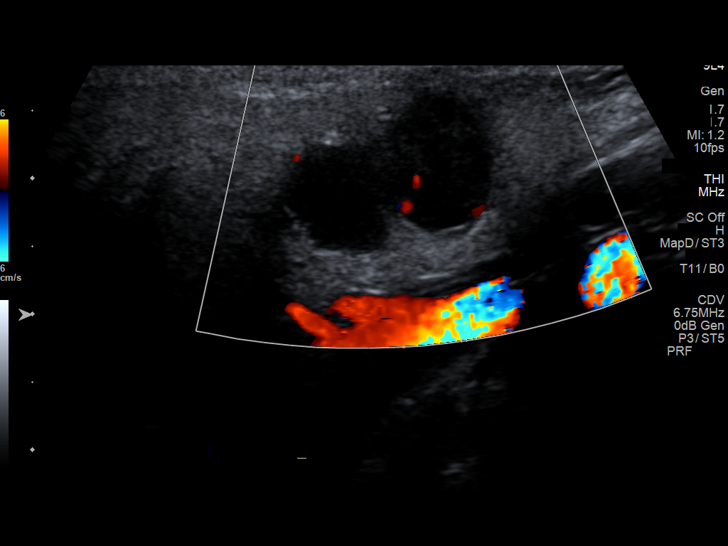
[im 8/29]
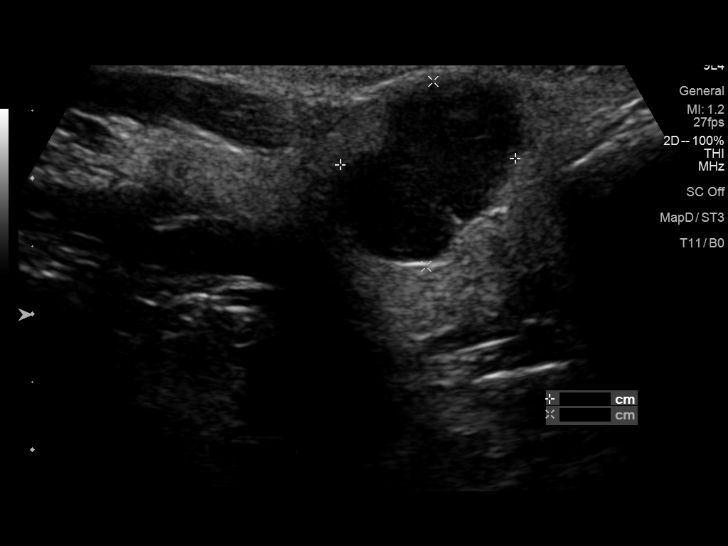
[im 10/29]
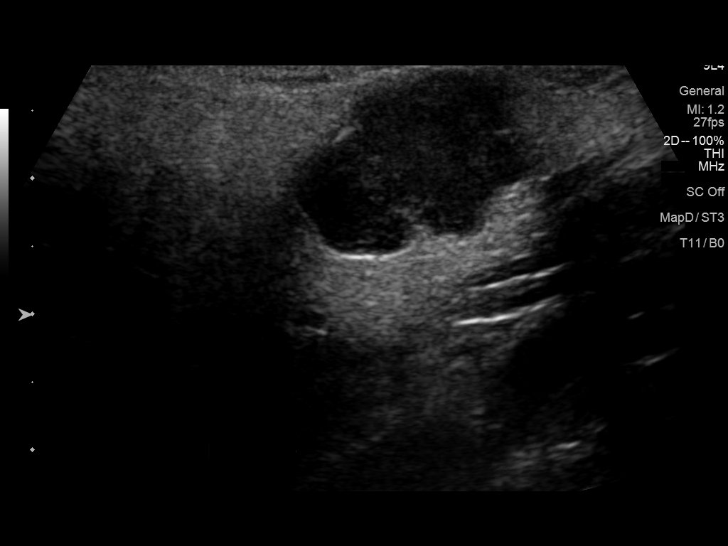
[im 11/29]
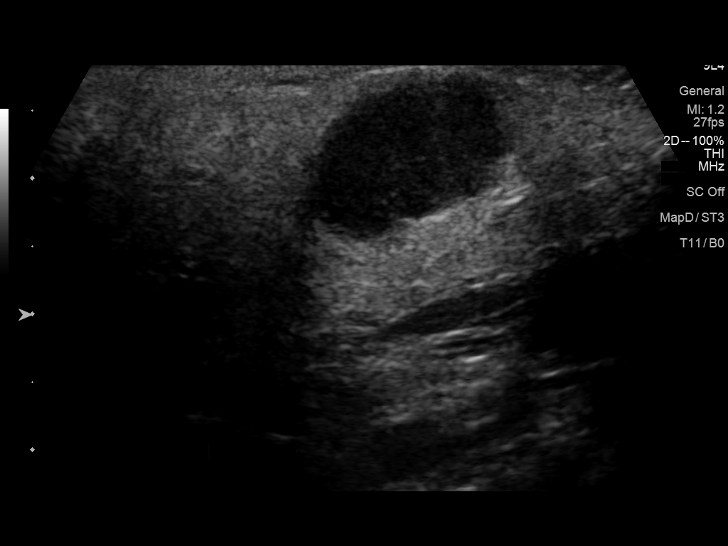
[im 13/29]
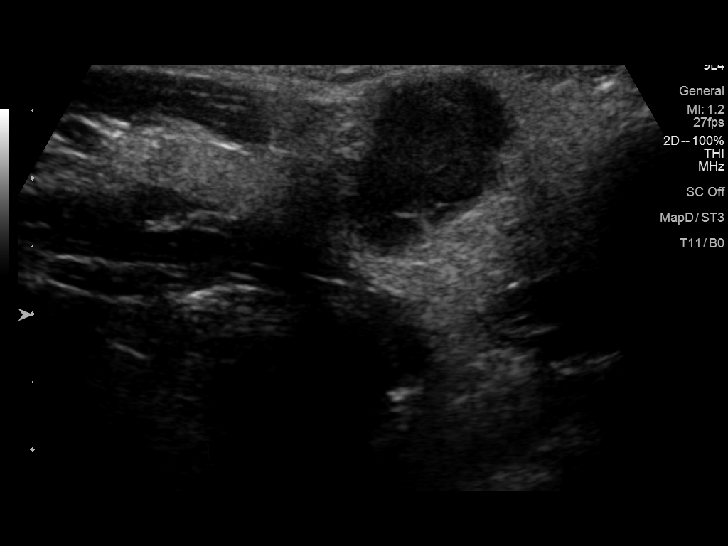
[im 16/29]
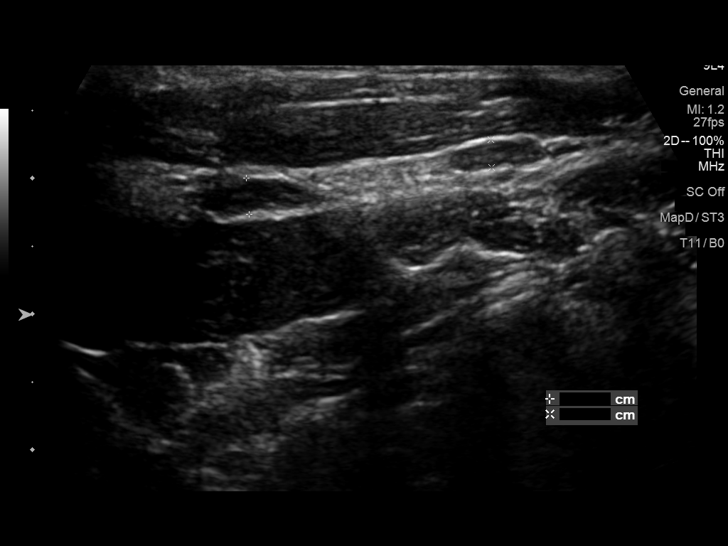
[im 18/29]
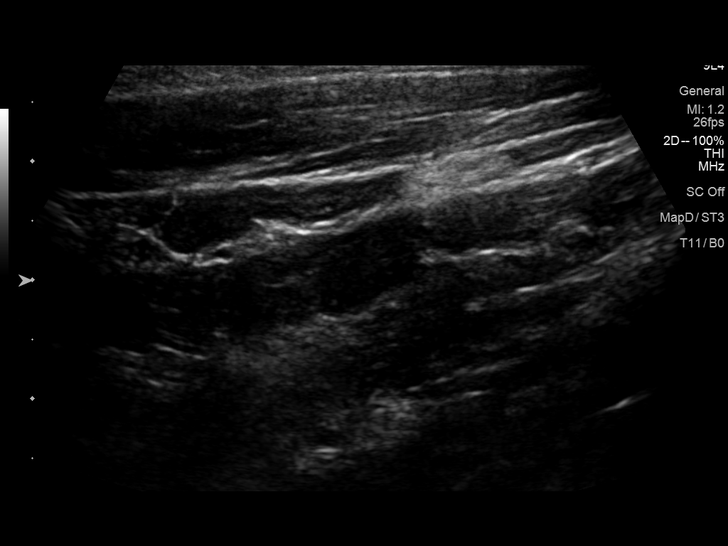
[im 19/29]
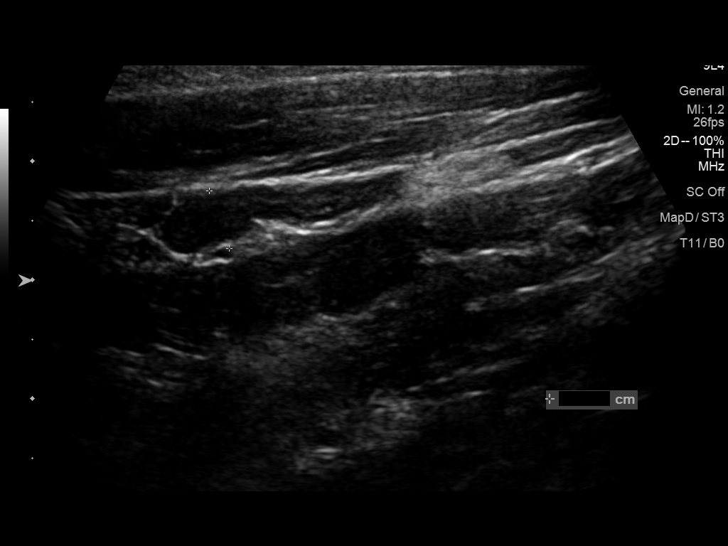
[im 22/29]
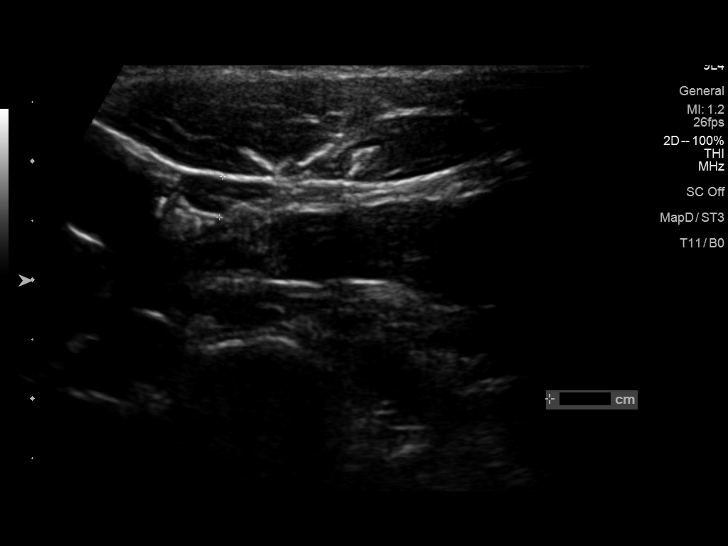
[im 24/29]
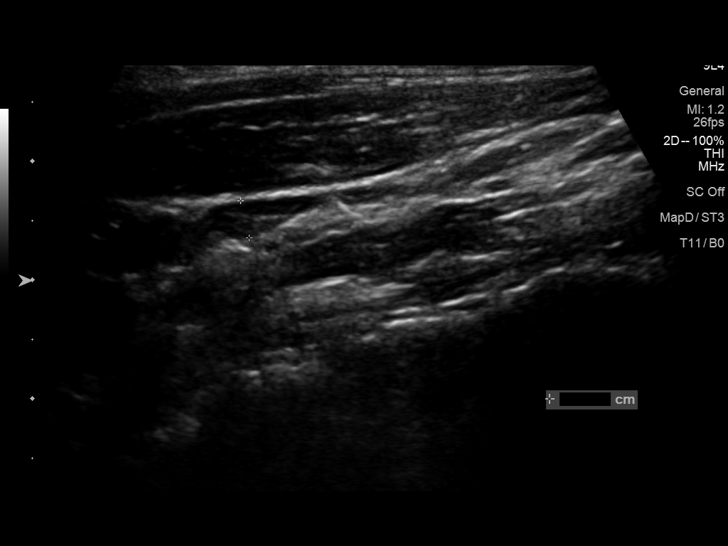
[im 26/29]
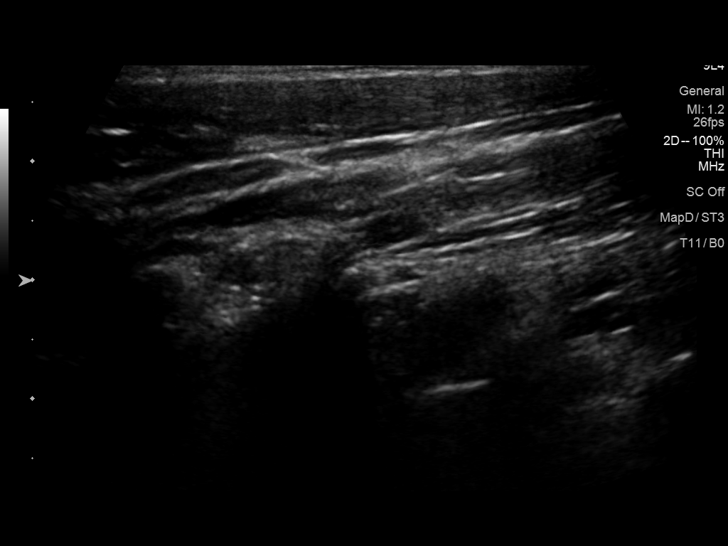
[im 29/29]
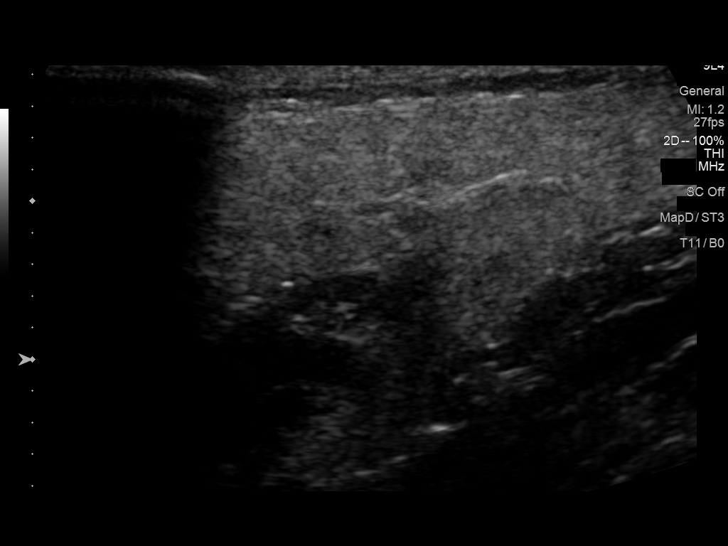

[14 of 25 positions shown; findings below may reference images not displayed]

FINDINGS: In the patient's palpable area of concern there is a lobular,
irregular hypoechoic mass in the right parotid gland. This mass
measures 1.3 x 1.4 x 1.8 cm. No additional concerning mass was
identified.
IMPRESSION: The patient's palpable area of concern corresponds to a 1.8 cm
right-sided parotid gland mass. Differential considerations include
an enlarged lymph node or primary parotid tumor. Follow-up with a
contrast-enhanced MRI is recommended.

## 2022-11-18 ENCOUNTER — Telehealth: Payer: Self-pay | Admitting: Nurse Practitioner

## 2022-11-18 NOTE — Telephone Encounter (Signed)
Lvmtcb to schedule appt

## 2023-03-22 ENCOUNTER — Encounter: Payer: Self-pay | Admitting: Nurse Practitioner
